# Patient Record
Sex: Female | Born: 1945 | Race: White | Hispanic: No | Marital: Married | Smoking: Former smoker
Health system: Southern US, Community
[De-identification: ages and names within clinical notes are randomized; demographics above are authoritative.]

## PROBLEM LIST (undated history)

## (undated) DIAGNOSIS — E7800 Pure hypercholesterolemia, unspecified: Secondary | ICD-10-CM

## (undated) DIAGNOSIS — C801 Malignant (primary) neoplasm, unspecified: Secondary | ICD-10-CM

## (undated) DIAGNOSIS — Z9221 Personal history of antineoplastic chemotherapy: Secondary | ICD-10-CM

## (undated) DIAGNOSIS — I10 Essential (primary) hypertension: Secondary | ICD-10-CM

## (undated) DIAGNOSIS — C50919 Malignant neoplasm of unspecified site of unspecified female breast: Secondary | ICD-10-CM

## (undated) DIAGNOSIS — Z853 Personal history of malignant neoplasm of breast: Secondary | ICD-10-CM

## (undated) DIAGNOSIS — Z923 Personal history of irradiation: Secondary | ICD-10-CM

## (undated) DIAGNOSIS — M1990 Unspecified osteoarthritis, unspecified site: Secondary | ICD-10-CM

## (undated) DIAGNOSIS — IMO0002 Reserved for concepts with insufficient information to code with codable children: Secondary | ICD-10-CM

## (undated) HISTORY — DX: Pure hypercholesterolemia, unspecified: E78.00

## (undated) HISTORY — DX: Unspecified osteoarthritis, unspecified site: M19.90

## (undated) HISTORY — DX: Malignant (primary) neoplasm, unspecified: C80.1

## (undated) HISTORY — PX: TONSILLECTOMY: SUR1361

## (undated) HISTORY — DX: Essential (primary) hypertension: I10

## (undated) HISTORY — PX: BREAST CYST ASPIRATION: SHX578

## (undated) HISTORY — DX: Personal history of malignant neoplasm of breast: Z85.3

---

## 1981-12-12 HISTORY — PX: ABDOMINAL HYSTERECTOMY: SHX81

## 1997-12-12 HISTORY — PX: ABDOMINOPLASTY: SUR9

## 2001-09-03 ENCOUNTER — Other Ambulatory Visit: Admission: RE | Admit: 2001-09-03 | Discharge: 2001-09-03 | Payer: Self-pay | Admitting: Family Medicine

## 2004-09-15 ENCOUNTER — Ambulatory Visit: Payer: Self-pay | Admitting: Unknown Physician Specialty

## 2004-09-21 ENCOUNTER — Ambulatory Visit: Payer: Self-pay

## 2005-11-17 ENCOUNTER — Ambulatory Visit: Payer: Self-pay

## 2006-11-20 ENCOUNTER — Ambulatory Visit: Payer: Self-pay | Admitting: Plastic Surgery

## 2008-01-16 ENCOUNTER — Ambulatory Visit: Payer: Self-pay | Admitting: Plastic Surgery

## 2008-02-29 ENCOUNTER — Encounter: Payer: Self-pay | Admitting: Family Medicine

## 2008-12-12 DIAGNOSIS — IMO0001 Reserved for inherently not codable concepts without codable children: Secondary | ICD-10-CM

## 2008-12-12 DIAGNOSIS — C50919 Malignant neoplasm of unspecified site of unspecified female breast: Secondary | ICD-10-CM

## 2008-12-12 DIAGNOSIS — Z9221 Personal history of antineoplastic chemotherapy: Secondary | ICD-10-CM

## 2008-12-12 DIAGNOSIS — C801 Malignant (primary) neoplasm, unspecified: Secondary | ICD-10-CM

## 2008-12-12 DIAGNOSIS — Z853 Personal history of malignant neoplasm of breast: Secondary | ICD-10-CM

## 2008-12-12 HISTORY — DX: Malignant neoplasm of unspecified site of unspecified female breast: C50.919

## 2008-12-12 HISTORY — DX: Personal history of malignant neoplasm of breast: Z85.3

## 2008-12-12 HISTORY — DX: Malignant (primary) neoplasm, unspecified: C80.1

## 2008-12-12 HISTORY — PX: OTHER SURGICAL HISTORY: SHX169

## 2008-12-12 HISTORY — DX: Reserved for inherently not codable concepts without codable children: IMO0001

## 2008-12-12 HISTORY — PX: BREAST LUMPECTOMY: SHX2

## 2008-12-12 HISTORY — DX: Personal history of antineoplastic chemotherapy: Z92.21

## 2008-12-12 HISTORY — PX: MM BREAST STEREO BX*L*R/S: HXRAD495

## 2008-12-12 HISTORY — PX: BREAST BIOPSY: SHX20

## 2009-02-10 ENCOUNTER — Ambulatory Visit: Payer: Self-pay | Admitting: Plastic Surgery

## 2009-02-11 ENCOUNTER — Ambulatory Visit: Payer: Self-pay | Admitting: Plastic Surgery

## 2009-02-11 ENCOUNTER — Encounter: Payer: Self-pay | Admitting: Family Medicine

## 2009-03-18 ENCOUNTER — Ambulatory Visit: Payer: Self-pay | Admitting: General Surgery

## 2009-03-27 ENCOUNTER — Ambulatory Visit: Payer: Self-pay | Admitting: General Surgery

## 2009-04-03 ENCOUNTER — Ambulatory Visit: Payer: Self-pay | Admitting: General Surgery

## 2009-04-11 ENCOUNTER — Ambulatory Visit: Payer: Self-pay | Admitting: Internal Medicine

## 2009-04-14 ENCOUNTER — Ambulatory Visit: Payer: Self-pay | Admitting: Internal Medicine

## 2009-05-12 ENCOUNTER — Ambulatory Visit: Payer: Self-pay | Admitting: Internal Medicine

## 2009-05-19 ENCOUNTER — Ambulatory Visit: Payer: Self-pay | Admitting: General Surgery

## 2009-05-20 ENCOUNTER — Ambulatory Visit: Payer: Self-pay | Admitting: Internal Medicine

## 2009-06-11 ENCOUNTER — Ambulatory Visit: Payer: Self-pay | Admitting: Internal Medicine

## 2009-07-12 ENCOUNTER — Ambulatory Visit: Payer: Self-pay | Admitting: Internal Medicine

## 2009-08-12 ENCOUNTER — Ambulatory Visit: Payer: Self-pay | Admitting: Internal Medicine

## 2009-08-20 ENCOUNTER — Ambulatory Visit: Payer: Self-pay | Admitting: General Surgery

## 2009-09-11 ENCOUNTER — Ambulatory Visit: Payer: Self-pay | Admitting: Internal Medicine

## 2009-10-12 ENCOUNTER — Ambulatory Visit: Payer: Self-pay | Admitting: Internal Medicine

## 2009-11-11 ENCOUNTER — Ambulatory Visit: Payer: Self-pay | Admitting: Internal Medicine

## 2009-12-12 ENCOUNTER — Ambulatory Visit: Payer: Self-pay | Admitting: Internal Medicine

## 2009-12-12 HISTORY — PX: COLONOSCOPY: SHX174

## 2010-01-12 ENCOUNTER — Ambulatory Visit: Payer: Self-pay | Admitting: Internal Medicine

## 2010-02-09 ENCOUNTER — Ambulatory Visit: Payer: Self-pay | Admitting: Internal Medicine

## 2010-02-11 ENCOUNTER — Ambulatory Visit: Payer: Self-pay | Admitting: General Surgery

## 2010-03-12 ENCOUNTER — Ambulatory Visit: Payer: Self-pay | Admitting: Internal Medicine

## 2010-04-11 ENCOUNTER — Ambulatory Visit: Payer: Self-pay | Admitting: Internal Medicine

## 2010-05-12 ENCOUNTER — Ambulatory Visit: Payer: Self-pay | Admitting: Internal Medicine

## 2010-06-10 IMAGING — NM NM  CARDIAC MUGA REST SCAN 2 0F 2
4 series · 24 of 24 positions shown · non-contrast
Comparison: none

REASON FOR EXAM: BREAST CA PRE CHEMO LVEF
COMMENTS:

[Series 1000: lao 45-gated (results) · 6.59mm/px · 6 of 24 frames shown]
[frame 3/24]
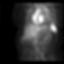
[frame 7/24]
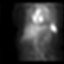
[frame 11/24]
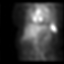
[frame 15/24]
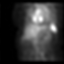
[frame 19/24]
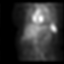
[frame 23/24]
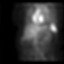

[Series 1000: lao 70-gated · 6.59mm/px · 6 of 24 frames shown]
[frame 3/24]
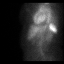
[frame 7/24]
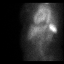
[frame 11/24]
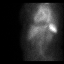
[frame 15/24]
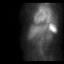
[frame 19/24]
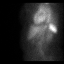
[frame 23/24]
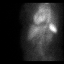

[Series 1000: ant-gated · 6.59mm/px · 6 of 24 frames shown]
[frame 3/24]
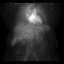
[frame 7/24]
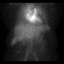
[frame 11/24]
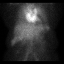
[frame 15/24]
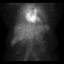
[frame 19/24]
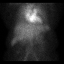
[frame 23/24]
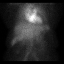

[Series 1000: lao 45-gated · 6.59mm/px · 6 of 24 frames shown]
[frame 3/24]
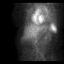
[frame 7/24]
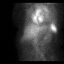
[frame 11/24]
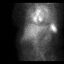
[frame 15/24]
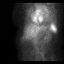
[frame 19/24]
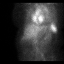
[frame 23/24]
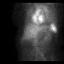

[24 of 24 positions shown; findings below may reference images not displayed]

PROCEDURE:     NM  - NM REST MUGA SCAN [DATE] OF [DATE]  - [DATE] [DATE] [DATE]  [DATE]

RESULT:     Following intravenous administration of 3.0 ml PYP and 25.61 mCi
Technetium 99m Pertechnetate, Rest MUGA Scan was performed. Review of the
left ventricular Cine loop shows no definite akinetic or dyskinetic
myocardial segments.

The left ventricular ejection fraction measures 53.5% which is within the
normal range.
IMPRESSION: Normal study. The left ventricular ejection fraction measures
53.5%.

## 2010-07-12 ENCOUNTER — Ambulatory Visit: Payer: Self-pay | Admitting: Internal Medicine

## 2010-07-14 ENCOUNTER — Ambulatory Visit: Payer: Self-pay | Admitting: Internal Medicine

## 2010-08-12 ENCOUNTER — Ambulatory Visit: Payer: Self-pay | Admitting: Internal Medicine

## 2010-08-31 ENCOUNTER — Ambulatory Visit: Payer: Self-pay | Admitting: Unknown Physician Specialty

## 2010-09-20 ENCOUNTER — Ambulatory Visit: Payer: Self-pay | Admitting: General Surgery

## 2010-12-20 ENCOUNTER — Ambulatory Visit
Admission: RE | Admit: 2010-12-20 | Discharge: 2010-12-20 | Payer: Self-pay | Source: Home / Self Care | Attending: Family Medicine | Admitting: Family Medicine

## 2010-12-20 ENCOUNTER — Encounter: Payer: Self-pay | Admitting: Family Medicine

## 2010-12-20 DIAGNOSIS — Z860100 Personal history of colon polyps, unspecified: Secondary | ICD-10-CM | POA: Insufficient documentation

## 2010-12-20 DIAGNOSIS — M1990 Unspecified osteoarthritis, unspecified site: Secondary | ICD-10-CM | POA: Insufficient documentation

## 2010-12-20 DIAGNOSIS — I10 Essential (primary) hypertension: Secondary | ICD-10-CM | POA: Insufficient documentation

## 2010-12-20 DIAGNOSIS — Z853 Personal history of malignant neoplasm of breast: Secondary | ICD-10-CM | POA: Insufficient documentation

## 2010-12-22 ENCOUNTER — Encounter: Payer: Self-pay | Admitting: Family Medicine

## 2010-12-22 ENCOUNTER — Ambulatory Visit: Payer: Self-pay | Admitting: Family Medicine

## 2010-12-23 ENCOUNTER — Encounter: Payer: Self-pay | Admitting: Family Medicine

## 2011-01-05 ENCOUNTER — Ambulatory Visit
Admission: RE | Admit: 2011-01-05 | Discharge: 2011-01-05 | Payer: Self-pay | Source: Home / Self Care | Attending: Family Medicine | Admitting: Family Medicine

## 2011-01-05 ENCOUNTER — Other Ambulatory Visit: Payer: Self-pay | Admitting: Family Medicine

## 2011-01-05 ENCOUNTER — Encounter: Payer: Self-pay | Admitting: Family Medicine

## 2011-01-05 LAB — BASIC METABOLIC PANEL
Calcium: 10.3 mg/dL (ref 8.4–10.5)
Creatinine, Ser: 0.8 mg/dL (ref 0.4–1.2)
GFR: 79.82 mL/min (ref >60.00)
Glucose, Bld: 94 mg/dL (ref 70–99)
Sodium: 140 mEq/L (ref 135–145)

## 2011-01-05 LAB — LIPID PANEL
Cholesterol: 332 mg/dL — ABNORMAL HIGH (ref 0–200)
HDL: 43.1 mg/dL (ref >39.00)
Total CHOL/HDL Ratio: 8
Triglycerides: 354 mg/dL — ABNORMAL HIGH (ref 0.0–149.0)

## 2011-01-13 NOTE — Assessment & Plan Note (Signed)
Summary: Medicare physical   Vital Signs:  Patient profile:   65 year old female Height:      58.50 inches Weight:      152.50 pounds BMI:     31.44 Temp:     97.9 degrees F oral Pulse rate:   60 / minute Pulse rhythm:   regular BP sitting:   122 / 82  (right arm) Cuff size:   regular  Vitals Entered By: Linde Gillis CMA Duncan Dull) (January 05, 2011 9:03 AM) CC: medicare physicial  Vision Screening:Left eye w/o correction: 20 / 20 Right Eye w/o correction: 20 / 15 Both eyes w/o correction:  20/ 20  Color vision testing: normal      Vision Entered By: Linde Gillis CMA Duncan Dull) (January 05, 2011 9:11 AM)  Hearing Screen  20db HL: Left  500 hz: 20db 1000 hz: 20db 2000 hz: 20db 4000 hz: 20db Right  500 hz: 20db 2000 hz: 20db 4000 hz: 20db   Hearing Testing Entered By: Linde Gillis CMA Duncan Dull) (January 05, 2011 9:11 AM)   History of Present Illness: I have personally reviewed the Medicare Annual Wellness questionnaire and have noted 1.   The patient's medical and social history- h/o breast CA- diagnosed April 2010, s/p lumpectomy, radiation and chemotherapy ( Paclitaxel x 12 weeks, herceptin x 1 year).  Doing well.  Followed by Dr. Sherrlyn Hock every six months.  Recent mammogram was normal.  No residual side effects from the chemotherapy or radiation therapy. Rad onc is Dr. Rushie Chestnut.  HTN- gets white coat HTN.  On Metoprolol 50 gm daily and HCTZ 12.5 mg daily.  No HA, blurred vision, CP, SOB or LE edema.  2.   Their use of alcohol, tobacco or illicit drugs- none 3.   Their current medications and supplements 4.   The patient's functional ability including ADL's, fall risks, home safety risks and hearing or visual  impairment.- see geriatrics assessment 5.   Diet and physical activities- very physically active. 6.   Evidence for depression or mood disorders- denies any signs or symptoms of depression         Allergies: 1)  ! * Ambien  Review of Systems      See  HPI General:  Denies malaise. Eyes:  Denies blurring. ENT:  Denies difficulty swallowing. CV:  Denies chest pain or discomfort. Resp:  Denies shortness of breath. GI:  Denies abdominal pain. GU:  Denies dysuria. MS:  Denies joint pain, joint redness, and joint swelling. Derm:  Denies rash. Neuro:  Denies headaches. Psych:  Denies anxiety and depression. Endo:  Denies cold intolerance and heat intolerance. Heme:  Denies abnormal bruising and bleeding.  Physical Exam  General:  alert, well-developed, and well-nourished.  alert, well-developed, and well-nourished.   Head:  normocephalic and atraumatic.  normocephalic and atraumatic.   Eyes:  vision grossly intact, pupils equal, pupils round, and pupils reactive to light.  vision grossly intact, pupils equal, pupils round, and pupils reactive to light.   Ears:  R ear normal and L ear normal.  R ear normal and L ear normal.   Nose:  no external deformity.  no external deformity.   Mouth:  good dentition.  good dentition.   Neck:  No deformities, masses, or tenderness noted. Breasts:  old surgical scars, no masses and no abnormal thickening.   Lungs:  Normal respiratory effort, chest expands symmetrically. Lungs are clear to auscultation, no crackles or wheezes. Heart:  Normal rate and regular rhythm. S1 and  S2 normal without gallop, murmur, click, rub or other extra sounds. Abdomen:  Bowel sounds positive,abdomen soft and non-tender without masses, organomegaly or hernias noted. Rectal:  no external abnormalities, no hemorrhoids, and no masses.   Genitalia:  normal introitus, no external lesions, no vaginal discharge, mucosa pink and moist, and no vaginal or cervical lesions.   vaginal atrophy Msk:  No deformity or scoliosis noted of thoracic or lumbar spine.   Extremities:  No clubbing, cyanosis, edema, or deformity noted with normal full range of motion of all joints.   Neurologic:  No cranial nerve deficits noted. Station and gait are  normal. Plantar reflexes are down-going bilaterally. DTRs are symmetrical throughout. Sensory, motor and coordinative functions appear intact. Skin:  Intact without suspicious lesions or rashes Psych:  Cognition and judgment appear intact. Alert and cooperative with normal attention span and concentration. No apparent delusions, illusions, hallucinations   Impression & Recommendations:  Problem # 1:  PREVENTIVE HEALTH CARE (ICD-V70.0) The patients weight, height, BMI and visual acuity have been recorded in the chart I have made referrals, counseling and provided education to the patient based review of the above and I have provided the pt with a written personalized care plan for preventive services.   Orders: Venipuncture (16109) Welcome to Medicare, Physical (U0454) Pelvic & Breast Exam ( Medicare)  (G0101) EKG w/ Interpretation (93000)  Complete Medication List: 1)  Metoprolol Tartrate 50 Mg Tabs (Metoprolol tartrate) .... Take one tablet by mouth daily 2)  Hydrochlorothiazide 12.5 Mg Caps (Hydrochlorothiazide) .... Take one tablet by mouth daily 3)  Diclofenac Sodium 75 Mg Tbec (Diclofenac sodium) .... Take one tablet by mouth daily 4)  Multivitamins Caps (Multiple vitamin) .... Take one tablet by mouth daily 5)  Aspirin 81 Mg Tabs (Aspirin) .... Take one tablet by mouth daily 6)  Vitamin D3 1000 Unit Caps (Cholecalciferol) .... Take one tablet by mouth daily 7)  Fish Oil 1000 Mg Caps (Omega-3 fatty acids) .... Take one tablet by mouth two times a day 8)  Milk Thistle 500 Mg Caps (Milk thistle) .... Take one tablet by mouth three times a day  Other Orders: TLB-Lipid Panel (80061-LIPID) TLB-BMP (Basic Metabolic Panel-BMET) (80048-METABOL)   Orders Added: 1)  TLB-Lipid Panel [80061-LIPID] 2)  TLB-BMP (Basic Metabolic Panel-BMET) [80048-METABOL] 3)  Venipuncture [09811] 4)  Welcome to Medicare, Physical [G0402] 5)  Pelvic & Breast Exam ( Medicare)  [G0101] 6)  EKG w/  Interpretation [93000]    Current Allergies (reviewed today): ! * AMBIEN  PAP Next Due:  Not Indicated    Prevention & Chronic Care Immunizations   Influenza vaccine: Not documented    Tetanus booster: Not documented    Pneumococcal vaccine: Pneumovax  (12/20/2010)   Pneumococcal vaccine due: None    H. zoster vaccine: 12/20/2010: Zostavax  Colorectal Screening   Hemoccult: Not documented   Hemoccult due: Not Indicated    Colonoscopy: abnormal- diverticulosis, polyps  (08/31/2010)   Colonoscopy due: 09/01/2015  Other Screening   Pap smear: Not documented   Pap smear due: Not Indicated    Mammogram: normal  (08/31/2010)   Mammogram due: 02/24/2011    DXA bone density scan: Not documented   DXA bone density action/deferral: Ordered  (12/20/2010)   DXA scan due: Not Indicated    Smoking status: Not documented  Lipids   Total Cholesterol: Not documented   Lipid panel action/deferral: Lipid Panel ordered   LDL: Not documented   LDL Direct: Not documented   HDL: Not documented  Triglycerides: Not documented  Hypertension   Last Blood Pressure: 122 / 82  (01/05/2011)   Serum creatinine: Not documented   BMP action: Ordered   Serum potassium Not documented  Self-Management Support :    Hypertension self-management support: Not documented   Geriatric Assessment:  Activities of Daily Living:    Bathing-independent    Dressing-independent    Eating-independent    Toileting-independent    Transferring-independent    Continence-independent  Instrumental Activities of Daily Living:    Transportation-independent    Meal/Food Preparation-independent    Shopping Errands-independent    Housekeeping/Chores-independent    Money Management/Finances-independent    Medication Management-independent    Ability to Use Telephone-independent    Laundry-independent

## 2011-01-13 NOTE — Letter (Signed)
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

## 2011-01-13 NOTE — Letter (Signed)
Summary: Records Dated 05-20-09 thru 07-28-10/[facility]  Records Dated 05-20-09 thru 07-28-10/[facility]   Imported By: Lanelle Bal 12/27/2010 15:22:43  _____________________________________________________________________  External Attachment:    Type:   Image     Comment:   External Document

## 2011-01-13 NOTE — Assessment & Plan Note (Signed)
Summary: new medicare patient/alc   Vital Signs:  Patient profile:   65 year old female Height:      58.50 inches Weight:      148.25 pounds BMI:     30.57 Temp:     97.9 degrees F oral Pulse rate:   72 / minute Pulse rhythm:   regular BP sitting:   140 / 90  (right arm) Cuff size:   regular  Vitals Entered By: Linde Gillis CMA Duncan Dull) (December 20, 2010 1:42 PM) CC: new medicare patient   History of Present Illness: 65 yo here to [facility] care.  1.  h/o breast CA- diagnosed April 2010, s/p lumpectomy, radiation and chemotherapy ( Paclitaxel x 12 weeks, herceptin x 1 year).  Doing well.  Followed by Dr. Sherrlyn Hock every six months.  Recent mammogram was normal.  No residual side effects from the chemotherapy or radiation therapy. Rad onc is Dr. Rosilyn Mings remember when last DEXA scan was done.  2.  HTN- gets white coat HTN.  On Metoprolol 50 gm daily and HCTZ 12.5 mg daily.  No HA, blurred vision, CP, SOB or LE edema.  Well woman- started Medicare in July, has not yet had a Welcome to Medicare physical. Does not think she has ever received Zostavax or Pneumovax.  Current Medications (verified): 1)  Metoprolol Tartrate 50 Mg Tabs (Metoprolol Tartrate) .... Take One Tablet By Mouth Daily 2)  Hydrochlorothiazide 12.5 Mg Caps (Hydrochlorothiazide) .... Take One Tablet By Mouth Daily 3)  Diclofenac Sodium 75 Mg Tbec (Diclofenac Sodium) .... Take One Tablet By Mouth Daily 4)  Multivitamins  Caps (Multiple Vitamin) .... Take One Tablet By Mouth Daily 5)  Aspirin 81 Mg Tabs (Aspirin) .... Take One Tablet By Mouth Daily 6)  Vitamin D3 1000 Unit Caps (Cholecalciferol) .... Take One Tablet By Mouth Daily 7)  Fish Oil 1000 Mg Caps (Omega-3 Fatty Acids) .... Take One Tablet By Mouth Two Times A Day 8)  Milk Thistle 500 Mg Caps (Milk Thistle) .... Take One Tablet By Mouth Two Times A Day  Allergies (verified): 1)  ! * Ambien  Past History:  Family History: Last updated:  12/20/2010 mother- alive, 69 yo Family History of Stroke F 1st degree relative <60 Breast CA- paternal aunt in her 54s  Social History: Last updated: 12/20/2010 Married 2 children Retired Tourist information centre manager. Lives in [place].  Past Medical History: Breast cancer, hx of (03/17/2009)-  s/p radiation and chemotherapy, followed by Dr. Sherrlyn Hock (onc), [place] (Rad onc). 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  Review of Systems      See HPI General:  Denies malaise. Eyes:  Denies blurring. ENT:  Denies difficulty swallowing. CV:  Denies chest pain or discomfort. Resp:  Denies shortness of breath. GI:  Denies abdominal pain and bloody stools. GU:  Denies dysuria. MS:  Complains of joint pain; denies joint redness, joint swelling, and loss of strength. Derm:  Denies rash. Neuro:  Denies headaches, tingling, visual disturbances, and weakness. Psych:  Denies anxiety and depression. Endo:  Denies cold intolerance and heat intolerance. Heme:  Denies abnormal bruising and bleeding.  Physical Exam  General:  alert, well-developed, and well-nourished.  alert, well-developed, and well-nourished.   Head:  normocephalic and  atraumatic.  normocephalic and atraumatic.   Eyes:  vision grossly intact, pupils equal, pupils round, and pupils reactive to light.  vision grossly intact, pupils equal, pupils round, and pupils reactive to light.   Ears:  R ear normal and L ear normal.  R ear normal and L ear normal.   Nose:  no external deformity.  no external deformity.   Mouth:  good dentition.  good dentition.   Neck:  No deformities, masses, or tenderness noted. Lungs:  Normal  respiratory effort, chest expands symmetrically. Lungs are clear to auscultation, no crackles or wheezes. Heart:  Normal rate and regular rhythm. S1 and S2 normal without gallop, murmur, click, rub or other extra sounds. Abdomen:  Bowel sounds positive,abdomen soft and non-tender without masses, organomegaly or hernias noted. Msk:  No deformity or scoliosis noted of thoracic or lumbar spine.   Extremities:  no edema Neurologic:  alert & oriented X3 and gait normal.  alert & oriented X3 and gait normal.   Skin:  Intact without suspicious lesions or rashes Cervical Nodes:  No lymphadenopathy noted Axillary Nodes:  No palpable lymphadenopathy Psych:  Oriented X3, memory intact for recent and remote, normally interactive, good eye contact, not anxious appearing, and not depressed appearing.  Oriented X3, memory intact for recent and remote, normally interactive, good eye contact, not anxious appearing, and not depressed appearing.     Impression & Recommendations:  Problem # 1:  BREAST CANCER, HX OF (ICD-V10.3) Assessment Unchanged doing well.  Followed by Dr. Sherrlyn Hock every 6 months.  Problem # 2:  HYPERTENSION (ICD-401.9) Assessment: Unchanged  elevated today, per pt white coat HTN.  Awaiting records. Her updated medication list for this problem includes:    Metoprolol Tartrate 50 Mg Tabs (Metoprolol tartrate) .Marland Kitchen... Take one tablet by mouth daily    Hydrochlorothiazide 12.5 Mg Caps (Hydrochlorothiazide) .Marland Kitchen... Take one tablet by mouth daily  Her updated medication list for this problem includes:    Metoprolol Tartrate 50 Mg Tabs (Metoprolol tartrate) .Marland Kitchen... Take one tablet by mouth daily    Hydrochlorothiazide 12.5 Mg Caps (Hydrochlorothiazide) .Marland Kitchen... Take one tablet by mouth daily  Problem # 3:  ? of OSTEOPENIA (ICD-733.90) Assessment: New  Will order DEXA scan today given h/o breast CA s/p treatment. Her updated medication list for this problem includes:    Vitamin D3 1000 Unit Caps  (Cholecalciferol) .Marland Kitchen... Take one tablet by mouth daily  Orders: Radiology Referral (Radiology)  Her updated medication list for this problem includes:    Vitamin D3 1000 Unit Caps (Cholecalciferol) .Marland Kitchen... Take one tablet by mouth daily  Problem # 4:  Preventive Health Care (ICD-V70.0) Assessment: Comment Only zostavax and pneumovax today. schedule welcome to medicare visit.  Complete Medication List: 1)  Metoprolol Tartrate 50 Mg Tabs (Metoprolol tartrate) .... Take one tablet by mouth daily 2)  Hydrochlorothiazide 12.5 Mg Caps (Hydrochlorothiazide) .... Take one tablet by mouth daily 3)  Diclofenac Sodium 75 Mg Tbec (Diclofenac sodium) .... Take one tablet by mouth daily 4)  Multivitamins Caps (Multiple vitamin) .... Take one tablet by mouth daily 5)  Aspirin 81 Mg Tabs (Aspirin) .... Take one tablet by mouth daily 6)  Vitamin D3 1000 Unit Caps (Cholecalciferol) .... Take one tablet by mouth daily 7)  Fish Oil 1000 Mg Caps (Omega-3 fatty acids) .... Take one tablet by mouth two times a day 8)  Milk Thistle 500 Mg Caps (Milk thistle) .... Take one tablet by mouth two times a day  Other Orders: Pneumococcal Vaccine (  14782) Admin 1st Vaccine (95621) Zoster (Shingles) Vaccine Live [phone]) Admin of Any Addtl Vaccine (78469)  Patient Instructions: 1)  Please stop by to see Shirlee Limerick on your way out. 2)  Schedule a Welcome to Medicare visit. 3)  It was great to meet you.   Orders Added: 1)  Radiology Referral [Radiology] 2)  Pneumococcal Vaccine [90732] 3)  Admin 1st Vaccine [90471] 4)  Zoster (Shingles) Vaccine Live [90736] 5)  Admin of Any Addtl Vaccine [90472] 6)  New Patient Level III [62952]   Pneumonia Vaccine (to be given today)  Zostavax (to be given today)   Pneumonia Vaccine (to be given today)  Current Allergies (reviewed today): ! * AMBIEN   Prevention & Chronic Care Immunizations   Influenza vaccine: Not documented    Tetanus booster: Not documented     Pneumococcal vaccine: Pneumovax  (12/20/2010)    H. zoster vaccine: 12/20/2010: Zostavax  Colorectal Screening   Hemoccult: Not documented   Hemoccult due: Not Indicated    Colonoscopy: abnormal- diverticulosis, polyps  (08/31/2010)   Colonoscopy due: 09/01/2015  Other Screening   Pap smear: Not documented    Mammogram: normal  (08/31/2010)   Mammogram due: 02/24/2011    DXA bone density scan: Not documented   DXA bone density action/deferral: Ordered  (12/20/2010)   DXA scan due: Not Indicated    Smoking status: Not documented  Lipids   Total Cholesterol: Not documented   LDL: Not documented   LDL Direct: Not documented   HDL: Not documented   Triglycerides: Not documented  Hypertension   Last Blood Pressure: 140 / 90  (12/20/2010)   Serum creatinine: Not documented   Serum potassium Not documented  Self-Management Support :    Hypertension self-management support: Not documented   Nursing Instructions: Give Pneumovax today Give Herpes zoster vaccine today Schedule screening DXA bone density scan (see order)    Flex Sig Next Due:  Not Indicated Colonoscopy Result Date:  08/31/2010 Colonoscopy Result:  abnormal- diverticulosis, polyps Colonoscopy Next Due:  5 yr Hemoccult Next Due:  Not Indicated Mammogram Result Date:  08/26/2010 Mammogram Result:  normal historical Mammogram Next Due:  6 mo Bone Density Next Due: Not Indicated

## 2011-01-13 NOTE — Miscellaneous (Signed)
Summary: Waiver of Liability for Zostavax  Waiver of Liability for Zostavax   Imported By: Maryln Gottron 12/23/2010 13:38:31  _____________________________________________________________________  External Attachment:    Type:   Image     Comment:   External Document

## 2011-01-13 NOTE — Letter (Signed)
Summary: Results Follow up Letter  [facility]  [address] [place], [place] [address]   Phone: [phone]  Fax: [phone]    12/23/2010 MRN: [id number]  [facility] [address] [place], [place]  [address]  Dear Ms. Gosselin,  The following are the results of your recent test(s):  Test         Result    Pap Smear:        Normal _____  Not Normal _____ Comments: ______________________________________________________ Cholesterol: LDL(Bad cholesterol):         Your goal is less than:         HDL (Good cholesterol):       Your goal is more than: Comments:  ______________________________________________________ Mammogram:        Normal _____  Not Normal _____ Comments:  ___________________________________________________________________ Hemoccult:        Normal _____  Not normal _______ Comments:    _____________________________________________________________________ Other Tests: Your Bone Density test was normal.  Enclosed is a copy of the report.    We routinely do not discuss normal results over the telephone.  If you desire a copy of the results, or you have any questions about this information we can discuss them at your next office visit.   Sincerely,      Dr. Ruthe Mannan

## 2011-01-14 ENCOUNTER — Ambulatory Visit: Payer: Self-pay | Admitting: Internal Medicine

## 2011-02-10 ENCOUNTER — Ambulatory Visit: Payer: Self-pay | Admitting: Internal Medicine

## 2011-02-14 ENCOUNTER — Other Ambulatory Visit: Payer: Self-pay | Admitting: Family Medicine

## 2011-02-14 ENCOUNTER — Other Ambulatory Visit (INDEPENDENT_AMBULATORY_CARE_PROVIDER_SITE_OTHER): Payer: Medicare Other

## 2011-02-14 ENCOUNTER — Encounter (INDEPENDENT_AMBULATORY_CARE_PROVIDER_SITE_OTHER): Payer: Self-pay | Admitting: *Deleted

## 2011-02-14 DIAGNOSIS — E785 Hyperlipidemia, unspecified: Secondary | ICD-10-CM

## 2011-02-14 LAB — LIPID PANEL: Triglycerides: 276 mg/dL — ABNORMAL HIGH (ref 0.0–149.0)

## 2011-02-14 LAB — HEPATIC FUNCTION PANEL
ALT: 38 U/L — ABNORMAL HIGH (ref 0–35)
AST: 28 U/L (ref 0–37)
Alkaline Phosphatase: 111 U/L (ref 39–117)
Total Bilirubin: 0.3 mg/dL (ref 0.3–1.2)

## 2011-02-14 LAB — LDL CHOLESTEROL, DIRECT: Direct LDL: 112.8 mg/dL

## 2011-03-23 ENCOUNTER — Ambulatory Visit: Payer: Self-pay | Admitting: General Surgery

## 2011-04-14 ENCOUNTER — Ambulatory Visit: Payer: Self-pay | Admitting: Internal Medicine

## 2011-04-15 ENCOUNTER — Telehealth: Payer: Self-pay | Admitting: *Deleted

## 2011-04-15 NOTE — Telephone Encounter (Signed)
Patient saw her oncologist this week and had lab work done her liver enzymes were elevated and Dr. Doree Barthel wanted you to be aware since she is on simvastatin. They are going to be faxing over the labs results.

## 2011-04-18 NOTE — Telephone Encounter (Signed)
Have we received these labs?

## 2011-04-18 NOTE — Telephone Encounter (Signed)
Patient advised as instructed via telephone.  She has an appt scheduled with Dr. Orvis Brill on 07/15/2011 and will have labs done that day and will get his office to fax Dr. Dayton Martes a copy of those results.

## 2011-04-18 NOTE — Telephone Encounter (Signed)
These labs have not yet been received or scanned into EPIC.  Faxed request form to [facility] today to get these labs faxed over asap.

## 2011-04-18 NOTE — Telephone Encounter (Signed)
Labs reviewed. Minimal elevation of LFTs. Repeat in 3 months (future orders placed). Will not d/c cholesterol medication unless they receive 3 times upper limits of normal.

## 2011-04-19 ENCOUNTER — Encounter: Payer: Self-pay | Admitting: Family Medicine

## 2011-05-13 ENCOUNTER — Ambulatory Visit: Payer: Self-pay | Admitting: Internal Medicine

## 2011-07-15 ENCOUNTER — Ambulatory Visit: Payer: Self-pay | Admitting: Internal Medicine

## 2011-08-01 ENCOUNTER — Other Ambulatory Visit: Payer: Self-pay | Admitting: *Deleted

## 2011-08-01 MED ORDER — DICLOFENAC SODIUM 75 MG PO TBEC: 75.0000 mg | DELAYED_RELEASE_TABLET | Freq: Two times a day (BID) | ORAL | Status: DC

## 2011-08-13 ENCOUNTER — Ambulatory Visit: Payer: Self-pay | Admitting: Internal Medicine

## 2011-09-27 ENCOUNTER — Other Ambulatory Visit: Payer: Self-pay | Admitting: *Deleted

## 2011-09-27 MED ORDER — METOPROLOL SUCCINATE ER 50 MG PO TB24: 50.0000 mg | ORAL_TABLET | Freq: Every day | ORAL | Status: DC

## 2011-09-29 ENCOUNTER — Ambulatory Visit: Payer: Self-pay | Admitting: General Surgery

## 2011-10-25 ENCOUNTER — Telehealth: Payer: Self-pay | Admitting: *Deleted

## 2011-10-25 MED ORDER — CIPROFLOXACIN HCL 250 MG PO TABS: 250.0000 mg | ORAL_TABLET | Freq: Two times a day (BID) | ORAL | Status: AC

## 2011-10-25 NOTE — Telephone Encounter (Signed)
Pt of Dr. Elmer Sow called stating that she has a UTI- has burning and pressure.  Her mother has died and pt's day today is filled with going back and forth to the funeral home, going to the grave site, and has visitation tonight and funeral tomorrow.  She says she doesn't have time to come in today, even to just leave a sample.  She is asking that antibiotic be called to [facility] [address].  She says she knows from past experience that the burning will be severe by tonight.  Please advise.

## 2011-10-25 NOTE — Telephone Encounter (Signed)
Given her circumstances - one time only -- can call in abx Will refill electronically  Follow up in 7-10 days with me to see that it is clear and see how she is doing please

## 2011-10-25 NOTE — Telephone Encounter (Signed)
Patient notified as instructed by telephone. Pt will call back in 7-10 days to update Dr Milinda Antis how she is doing. If she needs to be seen would prefer after Thanksgiving.

## 2011-12-22 ENCOUNTER — Other Ambulatory Visit: Payer: Self-pay | Admitting: *Deleted

## 2011-12-22 MED ORDER — SIMVASTATIN 20 MG PO TABS: 20.0000 mg | ORAL_TABLET | Freq: Every day | ORAL | Status: DC

## 2012-01-03 ENCOUNTER — Other Ambulatory Visit: Payer: Self-pay | Admitting: Family Medicine

## 2012-01-03 DIAGNOSIS — E785 Hyperlipidemia, unspecified: Secondary | ICD-10-CM

## 2012-01-03 DIAGNOSIS — I10 Essential (primary) hypertension: Secondary | ICD-10-CM

## 2012-01-05 ENCOUNTER — Other Ambulatory Visit (INDEPENDENT_AMBULATORY_CARE_PROVIDER_SITE_OTHER): Payer: Medicare Other

## 2012-01-05 DIAGNOSIS — E785 Hyperlipidemia, unspecified: Secondary | ICD-10-CM

## 2012-01-05 DIAGNOSIS — I10 Essential (primary) hypertension: Secondary | ICD-10-CM

## 2012-01-05 LAB — LIPID PANEL
LDL Cholesterol: 111 mg/dL — ABNORMAL HIGH (ref 0–99)
Total CHOL/HDL Ratio: 4
Triglycerides: 193 mg/dL — ABNORMAL HIGH (ref 0.0–149.0)

## 2012-01-05 LAB — BASIC METABOLIC PANEL
BUN: 20 mg/dL (ref 6–23)
CO2: 31 mEq/L (ref 19–32)
Chloride: 101 mEq/L (ref 96–112)
Creatinine, Ser: 0.9 mg/dL (ref 0.4–1.2)
Glucose, Bld: 131 mg/dL — ABNORMAL HIGH (ref 70–99)

## 2012-01-05 LAB — HEPATIC FUNCTION PANEL
Albumin: 4.3 g/dL (ref 3.5–5.2)
Alkaline Phosphatase: 89 U/L (ref 39–117)

## 2012-01-09 ENCOUNTER — Ambulatory Visit (INDEPENDENT_AMBULATORY_CARE_PROVIDER_SITE_OTHER): Payer: Medicare Other | Admitting: Family Medicine

## 2012-01-09 ENCOUNTER — Encounter: Payer: Self-pay | Admitting: Family Medicine

## 2012-01-09 VITALS — BP 180/90 | HR 76 | Temp 98.4°F | Ht 58.5 in | Wt 153.2 lb

## 2012-01-09 DIAGNOSIS — N3946 Mixed incontinence: Secondary | ICD-10-CM

## 2012-01-09 DIAGNOSIS — Z860100 Personal history of colon polyps, unspecified: Secondary | ICD-10-CM

## 2012-01-09 DIAGNOSIS — I10 Essential (primary) hypertension: Secondary | ICD-10-CM

## 2012-01-09 DIAGNOSIS — Z0000 Encounter for general adult medical examination without abnormal findings: Secondary | ICD-10-CM

## 2012-01-09 DIAGNOSIS — Z853 Personal history of malignant neoplasm of breast: Secondary | ICD-10-CM

## 2012-01-09 MED ORDER — HYDROCHLOROTHIAZIDE 12.5 MG PO TABS: 12.5000 mg | ORAL_TABLET | Freq: Every day | ORAL | Status: DC

## 2012-01-09 MED ORDER — METOPROLOL SUCCINATE ER 50 MG PO TB24: 50.0000 mg | ORAL_TABLET | Freq: Every day | ORAL | Status: DC

## 2012-01-09 MED ORDER — SIMVASTATIN 20 MG PO TABS: 20.0000 mg | ORAL_TABLET | Freq: Every day | ORAL | Status: DC

## 2012-01-09 MED ORDER — TOLTERODINE TARTRATE 1 MG PO TABS: 1.0000 mg | ORAL_TABLET | Freq: Two times a day (BID) | ORAL | Status: DC

## 2012-01-09 NOTE — Patient Instructions (Signed)
Great to see you. Please check your blood pressures at home and call me with an update of your readings.

## 2012-01-09 NOTE — Progress Notes (Signed)
The patient's medical and social history-  h/o breast CA- diagnosed April 2010, s/p lumpectomy, radiation and chemotherapy ( Paclitaxel x 12 weeks, herceptin x 1 year).  Doing well.  Followed by Dr. Sherrlyn Hock every six months.  Had mammogram last week, awaiting results.  No residual side effects from the chemotherapy or radiation therapy. Rad onc is Dr. Rushie Chestnut.  HTN- gets white coat HTN.  On Metoprolol 50 gm daily and HCTZ 12.5 mg daily.  No HA, blurred vision, CP, SOB or LE edema. Has not yet taken her blood pressure medication for the day but has it with her.  Well woman- DEXA scan excellent bone density, 12/2010. UTD on all prevention.  Mixed urinary incontinence- Has to wear a pad all the time, constant leakage. No dysuria.  I have personally reviewed the Medicare Annual Wellness questionnaire and have noted 1. The patient's medical and social history 2. Their use of alcohol, tobacco or illicit drugs 3. Their current medications and supplements 4. The patient's functional ability including ADL's, fall risks, home safety risks and hearing or visual             impairment. 5. Diet and physical activities 6. Evidence for depression or mood disorders  ROS: See HPI Patient reports no  vision/ hearing changes,anorexia, weight change, fever ,adenopathy, persistant / recurrent hoarseness, swallowing issues, chest pain, edema,persistant / recurrent cough, hemoptysis, dyspnea(rest, exertional, paroxysmal nocturnal), gastrointestinal  bleeding (melena, rectal bleeding), abdominal pain, excessive heart burn,  syncope, focal weakness, severe memory loss, concerning skin lesions, depression, anxiety, abnormal bruising/bleeding, major joint swelling, breast masses or abnormal vaginal bleeding.    Physical exam: BP 180/90  Pulse 76  Temp(Src) 98.4 F (36.9 C) (Oral)  Ht 4' 10.5" (1.486 m)  Wt 153 lb 4 oz (69.514 kg)  BMI 31.48 kg/m2  General:  Well-developed,well-nourished,in no acute  distress; alert,appropriate and cooperative throughout examination Head:  normocephalic and atraumatic.   Eyes:  vision grossly intact, pupils equal, pupils round, and pupils reactive to light.   Ears:  R ear normal and L ear normal.   Nose:  no external deformity.   Mouth:  good dentition.   Lungs:  Normal respiratory effort, chest expands symmetrically. Lungs are clear to auscultation, no crackles or wheezes. Heart:  Normal rate and regular rhythm. S1 and S2 normal without gallop, murmur, click, rub or other extra sounds. Abdomen:  Bowel sounds positive,abdomen soft and non-tender without masses, organomegaly or hernias noted. Msk:  No deformity or scoliosis noted of thoracic or lumbar spine.   Extremities:  No clubbing, cyanosis, edema, or deformity noted with normal full range of motion of all joints.   Neurologic:  alert & oriented X3 and gait normal.   Skin:  Intact without suspicious lesions or rashes Psych:  Cognition and judgment appear intact. Alert and cooperative with normal attention span and concentration. No apparent delusions, illusions, hallucinations    Assessment and Plan: 1. Routine general medical examination at a health care facility  The patients weight, height, BMI and visual acuity have been recorded in the chart I have made referrals, counseling and provided education to the patient based review of the above and I have provided the pt with a written personalized care plan for preventive services.   2. Mixed incontinence  Deteriorated.  Will try Detrol 1 mg twice daily, rx sent. Discussed avoiding bladder irritants as well.  3. HYPERTENSION  Deteriorated.  Has not taken her meds today.  Adivsed to take immediately and call with  home BP readings. The patient indicates understanding of these issues and agrees with the plan.   4. BREAST CANCER, HX OF  Stable, per oncology  5. COLONIC POLYPS, HX OF  IFOB

## 2012-01-12 ENCOUNTER — Encounter: Payer: Self-pay | Admitting: *Deleted

## 2012-01-12 ENCOUNTER — Other Ambulatory Visit: Payer: Medicare Other

## 2012-01-12 DIAGNOSIS — Z1211 Encounter for screening for malignant neoplasm of colon: Secondary | ICD-10-CM

## 2012-01-13 ENCOUNTER — Other Ambulatory Visit: Payer: Self-pay | Admitting: *Deleted

## 2012-01-13 MED ORDER — HYDROCHLOROTHIAZIDE 12.5 MG PO CAPS: 12.5000 mg | ORAL_CAPSULE | Freq: Every day | ORAL | Status: DC

## 2012-01-13 NOTE — Telephone Encounter (Signed)
Received faxed refill request, patient is requesting capsules instead of tablets.  Updated in Epic and new Rx sent in for capsules.

## 2012-01-16 ENCOUNTER — Ambulatory Visit: Payer: Self-pay | Admitting: Internal Medicine

## 2012-01-16 ENCOUNTER — Telehealth: Payer: Self-pay | Admitting: Family Medicine

## 2012-01-16 LAB — CBC CANCER CENTER
Basophil %: 0.4 %
Eosinophil #: 0.3 x10 3/mm (ref 0.0–0.7)
Eosinophil %: 3.2 %
HGB: 13.1 g/dL (ref 12.0–16.0)
Lymphocyte %: 32 %
MCHC: 34 g/dL (ref 32.0–36.0)
Monocyte %: 6.4 %
Neutrophil #: 4.6 x10 3/mm (ref 1.4–6.5)
Neutrophil %: 58 %
Platelet: 219 x10 3/mm (ref 150–440)

## 2012-01-16 NOTE — Telephone Encounter (Signed)
Patient was seen last Monday and her blood pressure was elevated.  She was told to call back with the readings of 3 days.  The readings were on 01/09/12-11:30am-173/80,2:00-165/86,7:30pm-147/76.  On 01/10/12 -8:30-183/91,12:00-171/88,6:00pm-182/90.  On 01/11/12-9:30am168/78,3:30pm-171/80,8:30pm-170/72.  On 01/12/12-8:30am-150/92.  [facility] and her bp was 180/97.

## 2012-01-16 NOTE — Telephone Encounter (Signed)
Please add a second dose of HCTZ daily- so she should be taking 25 mg daily. Please send in new rx for HCTZ 25 mg daily, continue other meds as directed. Call us tomorrow with her BP readings.

## 2012-01-16 NOTE — Telephone Encounter (Signed)
Patient advised as instructed via telephone.  Rx for HCTZ was sent to [facility] on 01/13/2012, patient requested that we send in capsules instead of tablets because the tablets are more expensive.  Advised patient that we did send in capsules.  Called [facility] to make sure they have capsules on file instead of tablets.

## 2012-01-17 ENCOUNTER — Telehealth: Payer: Self-pay | Admitting: Family Medicine

## 2012-01-17 NOTE — Telephone Encounter (Signed)
I left a message on patient's answering machine to call back and schedule an appointment to recheck her blood pressure.

## 2012-01-17 NOTE — Telephone Encounter (Signed)
Please have her make an appt to see me so we can recheck her BP here.

## 2012-01-17 NOTE — Telephone Encounter (Signed)
Patient called back with her blood pressure reading for today.  She took her blood pressure @ 1:10pm and it was left arm 188/96 and right arm 154/76.

## 2012-02-01 ENCOUNTER — Telehealth: Payer: Self-pay

## 2012-02-01 MED ORDER — OXYBUTYNIN CHLORIDE ER 5 MG PO TB24: 5.0000 mg | ORAL_TABLET | Freq: Every day | ORAL | Status: DC

## 2012-02-01 MED ORDER — OXYBUTYNIN CHLORIDE 5 MG PO TABS: 5.0000 mg | ORAL_TABLET | Freq: Two times a day (BID) | ORAL | Status: DC | PRN

## 2012-02-01 NOTE — Telephone Encounter (Signed)
Patient called regarding her Detrol prescription.  She needs it refilled and would like a 90 day supply, however, she is asking if there is something else that could be substituted that would be cheaper for her? If so, could you send a 90 day supply to her pharmacy? [employer]

## 2012-02-01 NOTE — Telephone Encounter (Addendum)
May try oxybutynin ER at 5mg  daily.  sent in - more side effects (dry mouth) but likely cheaper. Will also route to PCP in case any other recs.

## 2012-02-02 NOTE — Telephone Encounter (Signed)
Patient notified and will try the oxybutynin.

## 2012-02-10 ENCOUNTER — Ambulatory Visit: Payer: Self-pay | Admitting: Internal Medicine

## 2012-02-14 ENCOUNTER — Other Ambulatory Visit: Payer: Self-pay | Admitting: Family Medicine

## 2012-03-12 ENCOUNTER — Telehealth: Payer: Self-pay

## 2012-03-12 MED ORDER — HYDROCHLOROTHIAZIDE 25 MG PO TABS: 25.0000 mg | ORAL_TABLET | Freq: Every day | ORAL | Status: DC

## 2012-03-12 NOTE — Telephone Encounter (Signed)
HCTZ 25 mg daily sent to her pharmacy. Please have her follow up with me in 2-4 weeks.

## 2012-03-12 NOTE — Telephone Encounter (Signed)
Pt said she has been taking HCTZ 12.5 mg taking 2 caps daily. Pt is out of med and [facility] does not want to refill because said early. On 11/15/11 HCTZ 25 mg taking one cap daily was supposed to be called in. [facility] did not receive. On 01/17/12 pt was supposed to make appt to come in see Dr Dayton Martes and recheck BP. Pt did not make appt. Today pt said last couple of days her BP at home has been 136/80 and 140/80. Pt wants to know if  the HCTZ can be changed to 25 mg. Taking 1 daily or should pt be seen first by Dr Dayton Martes? Pt uses [facility] [address] and can be reached at [phone].

## 2012-03-12 NOTE — Telephone Encounter (Signed)
Advised pt, follow up appt scheduled.

## 2012-03-26 ENCOUNTER — Ambulatory Visit: Payer: Self-pay | Admitting: General Surgery

## 2012-04-04 ENCOUNTER — Ambulatory Visit (INDEPENDENT_AMBULATORY_CARE_PROVIDER_SITE_OTHER): Payer: Medicare Other | Admitting: Family Medicine

## 2012-04-04 ENCOUNTER — Encounter: Payer: Self-pay | Admitting: Family Medicine

## 2012-04-04 VITALS — BP 160/90 | HR 60 | Temp 98.3°F | Wt 142.0 lb

## 2012-04-04 DIAGNOSIS — R5383 Other fatigue: Secondary | ICD-10-CM

## 2012-04-04 DIAGNOSIS — I10 Essential (primary) hypertension: Secondary | ICD-10-CM

## 2012-04-04 DIAGNOSIS — E785 Hyperlipidemia, unspecified: Secondary | ICD-10-CM

## 2012-04-04 DIAGNOSIS — E538 Deficiency of other specified B group vitamins: Secondary | ICD-10-CM

## 2012-04-04 LAB — CBC WITH DIFFERENTIAL/PLATELET
Basophils Relative: 0.8 % (ref 0.0–3.0)
Eosinophils Relative: 5.7 % — ABNORMAL HIGH (ref 0.0–5.0)
Hemoglobin: 14 g/dL (ref 12.0–15.0)
Lymphocytes Relative: 34 % (ref 12.0–46.0)
MCHC: 33.3 g/dL (ref 30.0–36.0)
Monocytes Relative: 6.9 % (ref 3.0–12.0)
Neutro Abs: 3.1 10*3/uL (ref 1.4–7.7)
RBC: 4.64 Mil/uL (ref 3.87–5.11)

## 2012-04-04 LAB — VITAMIN B12: Vitamin B-12: 720 pg/mL (ref 211–911)

## 2012-04-04 MED ORDER — SIMVASTATIN 20 MG PO TABS: 20.0000 mg | ORAL_TABLET | Freq: Every day | ORAL | Status: DC

## 2012-04-04 MED ORDER — OXYBUTYNIN CHLORIDE ER 5 MG PO TB24: 5.0000 mg | ORAL_TABLET | Freq: Every day | ORAL | Status: DC

## 2012-04-04 MED ORDER — HYDROCHLOROTHIAZIDE 25 MG PO TABS: 25.0000 mg | ORAL_TABLET | Freq: Every day | ORAL | Status: DC

## 2012-04-04 MED ORDER — METOPROLOL SUCCINATE ER 50 MG PO TB24: 50.0000 mg | ORAL_TABLET | Freq: Every day | ORAL | Status: DC

## 2012-04-04 NOTE — Patient Instructions (Addendum)
Good to see you. Please keep me posted with your biopsy results. We will call you with your lab results. Please call me your blood pressure if they don't come down.

## 2012-04-04 NOTE — Progress Notes (Signed)
66 yo h/o follow up HTN with some other concerns.   HTN- gets white coat HTN.  On Metoprolol 50 gm daily and HCTZ 25 mg daily.  No HA, blurred vision, CP, SOB or LE edema. Bp had been well controlled. Was 120/70 at surgeon's office yesterday. Unfortunately, had to have a breast biopsy on right. With her h/o breast CA she has been understandably anxious since her biopsy. Was told she should have results with 3 days.  Fatigue- has been fatigued for years, since her chemotherapy. Last few months, increased fatigue.  No SOB or CP. No blood in stool. No dizziness.     Patient Active Problem List  Diagnoses  . HYPERTENSION  . OSTEOARTHRITIS  . BREAST CANCER, HX OF  . COLONIC POLYPS, HX OF  . HYPERLIPIDEMIA  . Routine general medical examination at a health care facility  . Mixed incontinence   No past medical history on file. No past surgical history on file. History  Substance Use Topics  . Smoking status: Former Games developer  . Smokeless tobacco: Not on file   Comment: quit 1983  . Alcohol Use: Yes     occassionally   No family history on file. Allergies  Allergen Reactions  . Zolpidem Tartrate     REACTION: hallucinations   Current Outpatient Prescriptions on File Prior to Visit  Medication Sig Dispense Refill  . aspirin 81 MG tablet Take 160 mg by mouth daily.      . Cholecalciferol (VITAMIN D3) 1000 UNITS CAPS Take by mouth daily.      . diclofenac (VOLTAREN) 75 MG EC tablet TAKE ONE TABLET BY MOUTH TWICE DAILY  180 tablet  0  . hydrochlorothiazide (HYDRODIURIL) 25 MG tablet Take 1 tablet (25 mg total) by mouth daily.  90 tablet  3  . metoprolol succinate (TOPROL-XL) 50 MG 24 hr tablet Take 1 tablet (50 mg total) by mouth daily.  90 tablet  3  . MILK THISTLE PO Take by mouth daily.      . MULTIPLE VITAMIN PO Take by mouth daily.      Marland Kitchen oxybutynin (DITROPAN-XL) 5 MG 24 hr tablet Take 1 tablet (5 mg total) by mouth daily.  90 tablet  3  . simvastatin (ZOCOR) 20 MG tablet  Take 1 tablet (20 mg total) by mouth at bedtime.  90 tablet  3   The PMH, PSH, Social History, Family History, Medications, and allergies have been reviewed in [facility], and have been updated if relevant.    ROS: See HPI   Physical exam: BP 160/90  Pulse 60  Temp(Src) 98.3 F (36.8 C) (Oral)  Wt 142 lb (64.411 kg) BP Readings from Last 3 Encounters:  04/04/12 160/90  01/09/12 180/90  01/05/11 122/82            General:  Well-developed,well-nourished,in no acute distress; alert,appropriate and cooperative throughout examination Head:  normocephalic and atraumatic.   Eyes:  vision grossly intact, pupils equal, pupils round, and pupils reactive to light.   Ears:  R ear normal and L ear normal.   Nose:  no external deformity.   Mouth:  good dentition.   Lungs:  Normal respiratory effort, chest expands symmetrically. Lungs are clear to auscultation, no crackles or wheezes. Heart:  Normal rate and regular rhythm. S1 and S2 normal without gallop, murmur, click, rub or other extra sounds. Abdomen:  Bowel sounds positive,abdomen soft and non-tender without masses, organomegaly or hernias noted. Msk:  No deformity or scoliosis noted of thoracic or  lumbar spine.   Extremities:  No clubbing, cyanosis, edema, or deformity noted with normal full range of motion of all joints.   Neurologic:  alert & oriented X3 and gait normal.   Skin:  Intact without suspicious lesions or rashes Psych:  Cognition and judgment appear intact. Alert and cooperative with normal attention span and concentration. No apparent delusions, illusions, hallucinations    Assessment and Plan:  1. Fatigue  Likely multifactorial.  Will check labs today to rule out any possible reversible causes. CBC with Differential, TSH  2. HYPERTENSION  Stable, likely elevated today due to understandable anxiety about biopsy results.  Advised to call me if her BP stays elevated. Refill meds at current dose. The patient indicates  understanding of these issues and agrees with the plan.

## 2012-07-24 ENCOUNTER — Other Ambulatory Visit: Payer: Self-pay | Admitting: Family Medicine

## 2012-08-07 ENCOUNTER — Ambulatory Visit: Payer: Self-pay | Admitting: Internal Medicine

## 2012-08-07 LAB — BASIC METABOLIC PANEL
BUN: 20 mg/dL — ABNORMAL HIGH (ref 7–18)
Calcium, Total: 9.3 mg/dL (ref 8.5–10.1)
Creatinine: 1.06 mg/dL (ref 0.60–1.30)
EGFR (African American): >60
EGFR (Non-African Amer.): 54 — ABNORMAL LOW
Glucose: 93 mg/dL (ref 65–99)
Sodium: 134 mmol/L — ABNORMAL LOW (ref 136–145)

## 2012-08-07 LAB — HEPATIC FUNCTION PANEL A (ARMC)
Albumin: 4 g/dL (ref 3.4–5.0)
Alkaline Phosphatase: 95 U/L (ref 50–136)
Bilirubin,Total: 0.3 mg/dL (ref 0.2–1.0)
SGPT (ALT): 48 U/L (ref 12–78)

## 2012-08-07 LAB — CBC CANCER CENTER
Basophil #: 0.1 x10 3/mm (ref 0.0–0.1)
Eosinophil #: 0.3 x10 3/mm (ref 0.0–0.7)
Eosinophil %: 3.2 %
HCT: 37.5 % (ref 35.0–47.0)
Lymphocyte #: 2.4 x10 3/mm (ref 1.0–3.6)
Lymphocyte %: 30.1 %
MCH: 29.7 pg (ref 26.0–34.0)
MCV: 90 fL (ref 80–100)
Monocyte #: 0.6 x10 3/mm (ref 0.2–0.9)
Platelet: 191 x10 3/mm (ref 150–440)
RBC: 4.15 10*6/uL (ref 3.80–5.20)
WBC: 8 x10 3/mm (ref 3.6–11.0)

## 2012-08-12 ENCOUNTER — Ambulatory Visit: Payer: Self-pay | Admitting: Internal Medicine

## 2012-08-20 ENCOUNTER — Telehealth: Payer: Self-pay

## 2012-08-20 MED ORDER — NYSTATIN & DIAPER RASH PRODUCT 100000 UNIT/GM EX KIT: PACK | CUTANEOUS | Status: DC

## 2012-08-20 NOTE — Addendum Note (Signed)
Addended by: Eliezer Bottom on: 08/20/2012 03:43 PM   Modules accepted: Orders

## 2012-08-20 NOTE — Telephone Encounter (Signed)
Pt request Nystatin 100,000 units Cream apply to affected area twice daily as needed; not on med list; last got from Dr Sullivan Lone; uses for perineal irritation or moisture from urinary leakage to [facility] [address].Please advise.

## 2012-08-20 NOTE — Telephone Encounter (Signed)
Ok to refill as requested 

## 2012-08-20 NOTE — Telephone Encounter (Signed)
Nystatin script changed to plain nystatin, not nystatin and diaper rash formula.  Advised pharmacy.

## 2012-11-26 ENCOUNTER — Other Ambulatory Visit: Payer: Self-pay | Admitting: Family Medicine

## 2013-02-09 ENCOUNTER — Encounter: Payer: Self-pay | Admitting: *Deleted

## 2013-02-13 ENCOUNTER — Ambulatory Visit (INDEPENDENT_AMBULATORY_CARE_PROVIDER_SITE_OTHER): Payer: Medicare Other | Admitting: Family Medicine

## 2013-02-13 ENCOUNTER — Encounter: Payer: Self-pay | Admitting: Family Medicine

## 2013-02-13 VITALS — BP 132/82 | HR 76 | Temp 98.2°F | Ht 58.5 in | Wt 139.0 lb

## 2013-02-13 DIAGNOSIS — E785 Hyperlipidemia, unspecified: Secondary | ICD-10-CM

## 2013-02-13 DIAGNOSIS — M1990 Unspecified osteoarthritis, unspecified site: Secondary | ICD-10-CM

## 2013-02-13 DIAGNOSIS — Z1331 Encounter for screening for depression: Secondary | ICD-10-CM

## 2013-02-13 DIAGNOSIS — N3946 Mixed incontinence: Secondary | ICD-10-CM

## 2013-02-13 DIAGNOSIS — Z1211 Encounter for screening for malignant neoplasm of colon: Secondary | ICD-10-CM

## 2013-02-13 DIAGNOSIS — I10 Essential (primary) hypertension: Secondary | ICD-10-CM

## 2013-02-13 DIAGNOSIS — Z0000 Encounter for general adult medical examination without abnormal findings: Secondary | ICD-10-CM

## 2013-02-13 LAB — CBC WITH DIFFERENTIAL/PLATELET
Eosinophils Relative: 4.4 % (ref 0.0–5.0)
HCT: 38.5 % (ref 36.0–46.0)
Hemoglobin: 13 g/dL (ref 12.0–15.0)
Lymphs Abs: 2.5 10*3/uL (ref 0.7–4.0)
Monocytes Relative: 7.7 % (ref 3.0–12.0)
Platelets: 206 10*3/uL (ref 150.0–400.0)
WBC: 5.8 10*3/uL (ref 4.5–10.5)

## 2013-02-13 LAB — LIPID PANEL
HDL: 46.1 mg/dL (ref >39.00)
Total CHOL/HDL Ratio: 4

## 2013-02-13 LAB — COMPREHENSIVE METABOLIC PANEL
ALT: 32 U/L (ref 0–35)
AST: 29 U/L (ref 0–37)
Albumin: 4.2 g/dL (ref 3.5–5.2)
Calcium: 9.5 mg/dL (ref 8.4–10.5)
Chloride: 99 mEq/L (ref 96–112)
Potassium: 4.2 mEq/L (ref 3.5–5.1)
Sodium: 140 mEq/L (ref 135–145)
Total Protein: 7.1 g/dL (ref 6.0–8.3)

## 2013-02-13 MED ORDER — MIRABEGRON ER 25 MG PO TB24: 25.0000 mg | ORAL_TABLET | Freq: Every day | ORAL | Status: DC

## 2013-02-13 MED ORDER — DICLOFENAC SODIUM 75 MG PO TBEC: DELAYED_RELEASE_TABLET | ORAL | Status: DC

## 2013-02-13 MED ORDER — HYDROCHLOROTHIAZIDE 25 MG PO TABS: 25.0000 mg | ORAL_TABLET | Freq: Every day | ORAL | Status: DC

## 2013-02-13 MED ORDER — METOPROLOL SUCCINATE ER 50 MG PO TB24: 50.0000 mg | ORAL_TABLET | Freq: Every day | ORAL | Status: DC

## 2013-02-13 NOTE — Addendum Note (Signed)
Addended by: Alvina Chou on: 02/13/2013 01:20 PM   Modules accepted: Orders

## 2013-02-13 NOTE — Progress Notes (Signed)
67 yo here for annual medicare wellness visit.  Doing really well.  Her son is getting married in April!  h/o breast CA- diagnosed April 2010, s/p lumpectomy, radiation and chemotherapy ( Paclitaxel x 12 weeks, herceptin x 1 year).  Doing well.  Followed by Dr. Sherrlyn Hock every six months.  Had mammogram last year.Dr. Doristine Counter orders that.  No residual side effects from the chemotherapy or radiation therapy. Rad onc is Dr. Rushie Chestnut.  HTN- gets white coat HTN.  On Metoprolol 50 mg daily and HCTZ 12.5 mg daily.  No HA, blurred vision, CP, SOB or LE edema.  Well woman- DEXA scan excellent bone density, 12/2010.  Dr. Doristine Counter orders these.     Mixed urinary incontinence- Started Ditropan last year which has helped significantly but she has severe dry mouth and constipation.  HLD- on Zocor 20 mg daily daily.  Denies myalgias.  Lab Results  Component Value Date   CHOL 195 01/05/2012   HDL 45.40 01/05/2012   LDLCALC 111* 01/05/2012   LDLDIRECT 112.8 02/14/2011   TRIG 193.0* 01/05/2012   CHOLHDL 4 01/05/2012    I have personally reviewed the Medicare Annual Wellness questionnaire and have noted 1. The patient's medical and social history 2. Their use of alcohol, tobacco or illicit drugs 3. Their current medications and supplements 4. The patient's functional ability including ADL's, fall risks, home safety risks and hearing or visual             impairment. 5. Diet and physical activities 6. Evidence for depression or mood disorders  ROS: See HPI Patient reports no  vision/ hearing changes,anorexia, weight change, fever ,adenopathy, persistant / recurrent hoarseness, swallowing issues, chest pain, edema,persistant / recurrent cough, hemoptysis, dyspnea(rest, exertional, paroxysmal nocturnal), gastrointestinal  bleeding (melena, rectal bleeding), abdominal pain, excessive heart burn,  syncope, focal weakness, severe memory loss, concerning skin lesions, depression, anxiety, abnormal  bruising/bleeding, major joint swelling, breast masses or abnormal vaginal bleeding.    Physical exam: BP 132/82  Pulse 76  Temp(Src) 98.2 F (36.8 C) (Oral)  Ht 4' 10.5" (1.486 m)  Wt 139 lb (63.05 kg)  BMI 28.55 kg/m2  SpO2 98%  General:  Well-developed,well-nourished,in no acute distress; alert,appropriate and cooperative throughout examination Head:  normocephalic and atraumatic.   Eyes:  vision grossly intact, pupils equal, pupils round, and pupils reactive to light.   Ears:  R ear normal and L ear normal.   Nose:  no external deformity.   Mouth:  good dentition.   Lungs:  Normal respiratory effort, chest expands symmetrically. Lungs are clear to auscultation, no crackles or wheezes. Heart:  Normal rate and regular rhythm. S1 and S2 normal without gallop, murmur, click, rub or other extra sounds. Abdomen:  Bowel sounds positive,abdomen soft and non-tender without masses, organomegaly or hernias noted. Msk:  No deformity or scoliosis noted of thoracic or lumbar spine.   Extremities:  No clubbing, cyanosis, edema, or deformity noted with normal full range of motion of all joints.   Neurologic:  alert & oriented X3 and gait normal.   Skin:  Intact without suspicious lesions or rashes Psych:  Cognition and judgment appear intact. Alert and cooperative with normal attention span and concentration. No apparent delusions, illusions, hallucinations    Assessment and Plan: 1. Routine general medical examination at a health care facility  The patients weight, height, BMI and visual acuity have been recorded in the chart I have made referrals, counseling and provided education to the patient based review of the above  and I have provided the pt with a written personalized care plan for preventive services.  Orders Placed This Encounter  Procedures  . Fecal occult blood, imunochemical  . Comprehensive metabolic panel  . Lipid Panel  . CBC with Differential     2. Mixed incontinence   Improved but having significant anticholingeric side effects.  Will d/c ditropan.  Start Myrbetriq. Discussed avoiding bladder irritants as well.  3. HYPERTENSION  Well controlled on current meds.   4. BREAST CANCER, HX OF  Stable, per oncology  5. COLONIC POLYPS, HX OF  IFOB

## 2013-02-13 NOTE — Patient Instructions (Addendum)
Great to see you and congratulations on your new daughter in law! Please STOP taking the Ditropan and start taking Myrbetriq.  We will call you with your lab results.  Please mention your bone density scan at your next appointment with Dr. Doristine Counter.

## 2013-02-14 ENCOUNTER — Telehealth: Payer: Self-pay

## 2013-02-14 MED ORDER — OXYBUTYNIN CHLORIDE ER 5 MG PO TB24: 5.0000 mg | ORAL_TABLET | Freq: Every day | ORAL | Status: DC

## 2013-02-14 NOTE — Telephone Encounter (Signed)
Unfortunately, there are no other options other than restarting the oxybutynin.  Yes she needs to continue simvastatin.  I'm sorry to hear the copay was so high for the mirabegron.

## 2013-02-14 NOTE — Telephone Encounter (Signed)
Left message asking patient to call back

## 2013-02-14 NOTE — Telephone Encounter (Signed)
Pt went to pick up Mirabegron ER and co pay was $80. Pt did not pick up med and wanted to know if there is another med that can be substituted which is more affordable or should pt go back on Oxybutynin ER. Pt wants to know if needs to continue Simvastatin; pt thinks this is for urine leakage; I advised Simvastatin is for lowering cholesterol.Please advise. [facility] [address]

## 2013-02-14 NOTE — Telephone Encounter (Signed)
Advised patient as instructed.  Refill on ox[facility]butynin sent to [facility].

## 2013-03-05 ENCOUNTER — Encounter: Payer: Self-pay | Admitting: *Deleted

## 2013-03-05 ENCOUNTER — Other Ambulatory Visit (INDEPENDENT_AMBULATORY_CARE_PROVIDER_SITE_OTHER): Payer: Medicare Other

## 2013-03-05 DIAGNOSIS — Z1211 Encounter for screening for malignant neoplasm of colon: Secondary | ICD-10-CM

## 2013-03-05 LAB — FECAL OCCULT BLOOD, IMMUNOCHEMICAL: Fecal Occult Bld: NEGATIVE

## 2013-03-28 ENCOUNTER — Ambulatory Visit: Payer: Self-pay | Admitting: General Surgery

## 2013-04-01 ENCOUNTER — Encounter: Payer: Self-pay | Admitting: *Deleted

## 2013-04-01 ENCOUNTER — Encounter: Payer: Self-pay | Admitting: General Surgery

## 2013-04-02 ENCOUNTER — Other Ambulatory Visit: Payer: Self-pay | Admitting: Family Medicine

## 2013-04-23 ENCOUNTER — Encounter: Payer: Self-pay | Admitting: General Surgery

## 2013-04-23 ENCOUNTER — Ambulatory Visit (INDEPENDENT_AMBULATORY_CARE_PROVIDER_SITE_OTHER): Payer: Medicare Other | Admitting: General Surgery

## 2013-04-23 ENCOUNTER — Other Ambulatory Visit: Payer: Self-pay | Admitting: *Deleted

## 2013-04-23 VITALS — BP 128/72 | HR 72 | Resp 14 | Ht 58.5 in | Wt 139.0 lb

## 2013-04-23 DIAGNOSIS — Z853 Personal history of malignant neoplasm of breast: Secondary | ICD-10-CM

## 2013-04-23 DIAGNOSIS — C50919 Malignant neoplasm of unspecified site of unspecified female breast: Secondary | ICD-10-CM

## 2013-04-23 DIAGNOSIS — C50912 Malignant neoplasm of unspecified site of left female breast: Secondary | ICD-10-CM

## 2013-04-23 NOTE — Patient Instructions (Addendum)
Patient to return in 1 year with bilateral diagnostic mammogram.  

## 2013-04-23 NOTE — Progress Notes (Signed)
Patient ID: Kathleen Jones, female   DOB: December 25, 1945, 67 y.o.   MRN: [id number]  Chief Complaint  Patient presents with  . Follow-up    mammogram    HPI Kathleen Jones is a 67 y.o. female.  Patient here today for follow up mammogram. She has a known history of a wide local excision and sentinel node biopsy her for left breast cancer in April 03, 2009. 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 Sentinel nodes were negative. She underwent adjuvant chemotherapy followed by whole breast radiation. Her power port was removed after completion of Herceptin therapy in May 2011. She had a FNA right breast April 2013 that was benign. HPI  Past Medical History  Diagnosis Date  . Cancer 2010     wide local excision and sentinel node biopsy her for left breast cancer in April 03, 2009. 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 Sentinel nodes were negative  . High cholesterol   . Personal history of malignant neoplasm of breast 2010  . Arthritis   . Hypertension     Past Surgical History  Procedure Laterality Date  . Breast lumpectomy Left 2010     wide local excision and sentinel node biopsy her for left breast cancer in April 03, 2009. 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 Sentinel nodes were negative  . Colonoscopy  2011    dR. eLLIOTT  . Tonsillectomy    . Abdominal hysterectomy  1983  . Abdominoplasty  1999  . Power port placement   2010  . Mm breast stereo bx*l*r/s Left 2010    Family History  Problem Relation Age of Onset  . Breast cancer Paternal Aunt   . Ovarian cancer    . Colon cancer      Social History History  Substance Use Topics  . Smoking status: Former Games developer  . Smokeless tobacco: Not on file     Comment: quit 1983  . Alcohol Use: Yes     Comment:  occassionally    Allergies  Allergen Reactions  . Zolpidem Tartrate     REACTION: hallucinations    Current Outpatient Prescriptions  Medication Sig Dispense Refill  . aspirin 81 MG tablet Take 160 mg by mouth daily.      . Cholecalciferol (VITAMIN D3) 1000 UNITS CAPS Take by mouth daily.      . diclofenac (VOLTAREN) 75 MG EC tablet TAKE ONE TABLET BY MOUTH TWICE DAILY  180 tablet  0  . docusate sodium (COLACE) 100 MG capsule Take 100 mg by mouth as needed for constipation.      . hydrochlorothiazide (HYDRODIURIL) 25 MG tablet Take 1 tablet (25 mg total) by mouth daily.  90 tablet  3  . metoprolol succinate (TOPROL-XL) 50 MG 24 hr tablet Take 1 tablet (50 mg total) by mouth daily.  90 tablet  3  . milk thistle 175 MG tablet Take 175 mg by mouth daily.      . mirabegron ER (MYRBETRIQ) 25 MG TB24 Take 1 tablet (25 mg total) by mouth daily.  90 tablet  3  . MULTIPLE VITAMIN PO Take by mouth daily.      Marland Kitchen nystatin cream (MYCOSTATIN) Apply to affected area twice daily as needed.      Marland Kitchen oxybutynin (DITROPAN-XL) 5 MG 24 hr tablet Take 1 tablet (5 mg total) by mouth daily.  90 tablet  1  . simvastatin (ZOCOR) 20 MG tablet TAKE ONE TABLET BY MOUTH AT BEDTIME  90 tablet  1  . triamcinolone cream (KENALOG) 0.1 % Apply topically as needed.       No current facility-administered medications for this visit.    Review of Systems Review of Systems  Constitutional: Negative.   Respiratory: Negative.   Cardiovascular: Negative.     Blood pressure 128/72, pulse 72, resp. rate 14, height 4' 10.5" (1.486 m), weight 139 lb (63.05 kg).  Physical Exam Physical Exam  Constitutional: She appears well-developed and well-nourished.  Neck: Trachea normal. No mass and no thyromegaly present.  Cardiovascular: Normal rate, regular rhythm, normal heart sounds and normal pulses.   No murmur heard. Pulmonary/Chest: Effort normal and breath sounds normal. Right breast exhibits no inverted nipple, no mass, no  nipple discharge, no skin change and no tenderness. Left breast exhibits no inverted nipple, no mass, no nipple discharge, no skin change and no tenderness.  Lymphadenopathy:    She has no cervical adenopathy.    She has no axillary adenopathy.    Data Reviewed Bilateral mammograms that 03/28/2013 were reviewed. Postoperative changes are appreciated. BI-RAD-2.  Assessment    No evidence of recurrent disease.    Plan    Arrangements will be made for followup bilateral mammogram with office visit in one year.       Kathleen Jones 04/24/2013, 6:37 AM

## 2013-04-23 NOTE — Progress Notes (Signed)
The patient has been asked to return to the office in one year for a bilateral diagnostic mammogram. 

## 2013-04-24 ENCOUNTER — Encounter: Payer: Self-pay | Admitting: General Surgery

## 2013-08-05 ENCOUNTER — Ambulatory Visit: Payer: Self-pay | Admitting: Internal Medicine

## 2013-10-11 ENCOUNTER — Other Ambulatory Visit: Payer: Self-pay | Admitting: Family Medicine

## 2013-10-11 MED ORDER — SIMVASTATIN 20 MG PO TABS: ORAL_TABLET | ORAL | Status: DC

## 2013-11-26 ENCOUNTER — Encounter: Payer: Medicare Other | Admitting: Family Medicine

## 2013-11-26 ENCOUNTER — Encounter: Payer: Self-pay | Admitting: Internal Medicine

## 2013-11-26 ENCOUNTER — Other Ambulatory Visit: Payer: Self-pay | Admitting: Family Medicine

## 2013-11-26 ENCOUNTER — Ambulatory Visit (INDEPENDENT_AMBULATORY_CARE_PROVIDER_SITE_OTHER): Payer: Medicare Other | Admitting: Internal Medicine

## 2013-11-26 VITALS — BP 136/84 | HR 84 | Temp 98.6°F | Wt 149.8 lb

## 2013-11-26 DIAGNOSIS — J069 Acute upper respiratory infection, unspecified: Secondary | ICD-10-CM

## 2013-11-26 MED ORDER — HYDROCODONE-HOMATROPINE 5-1.5 MG/5ML PO SYRP: 5.0000 mL | ORAL_SOLUTION | Freq: Three times a day (TID) | ORAL | Status: DC | PRN

## 2013-11-26 NOTE — Patient Instructions (Signed)

## 2013-11-26 NOTE — Telephone Encounter (Signed)
Pt got to pharmacy and RX was not signed.  She is out of the office.  RX printed again for Dr. Para March to sign for pt.

## 2013-11-26 NOTE — Progress Notes (Signed)
HPI  Pt presents to the clinic today with c/o cough. This started about 3 days ago. She also c/o fever, chest congestion and body aches. The cough is unproductive. She has tried zicam OTC with some relief. She has no history of allergies or asthma. She has not had sick contacts that she is aware of.  Review of Systems      Past Medical History  Diagnosis Date  . Cancer 2010     wide local excision and sentinel node biopsy her for left breast cancer in April 03, 2009. 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 Sentinel nodes were negative  . High cholesterol   . Personal history of malignant neoplasm of breast 2010  . Arthritis   . Hypertension     Family History  Problem Relation Age of Onset  . Breast cancer Paternal Aunt   . Ovarian cancer    . Colon cancer      History   Social History  . Marital Status: Married    Spouse Name: N/A    Number of Children: N/A  . Years of Education: N/A   Occupational History  . Not on file.   Social History Main Topics  . Smoking status: Former Games developer  . Smokeless tobacco: Not on file     Comment: quit 1983  . Alcohol Use: Yes     Comment: occassionally  . Drug Use: No  . Sexual Activity: Not on file   Other Topics Concern  . Not on file   Social History Narrative  . No narrative on file    Allergies  Allergen Reactions  . Zolpidem Tartrate     REACTION: hallucinations     Constitutional: Positive headache, fatigue and fever. Denies abrupt weight changes.  HEENT:  Positive sore throat. Denies eye redness, eye pain, pressure behind the eyes, facial pain, nasal congestion, ear pain, ringing in the ears, wax buildup, runny nose or bloody nose. Respiratory: Positive cough. Denies difficulty breathing or shortness of breath.  Cardiovascular: Denies chest pain, chest tightness, palpitations or swelling in the hands or feet.   No other specific complaints  in a complete review of systems (except as listed in HPI above).  Objective:   BP 136/84  Pulse 84  Temp(Src) 98.6 F (37 C) (Oral)  Wt 149 lb 12 oz (67.926 kg)  SpO2 97% Wt Readings from Last 3 Encounters:  11/26/13 149 lb 12 oz (67.926 kg)  04/23/13 139 lb (63.05 kg)  02/13/13 139 lb (63.05 kg)     General: Appears her stated age, well developed, well nourished in NAD. HEENT: Head: normal shape and size; Eyes: sclera white, no icterus, conjunctiva pink, PERRLA and EOMs intact; Ears: Tm's gray and intact, normal light reflex; Nose: mucosa pink and moist, septum midline; Throat/Mouth: + PND. Teeth present, mucosa erythematous and moist, no exudate noted, no lesions or ulcerations noted.  Neck: Mild cervical lymphadenopathy. Neck supple, trachea midline. No massses, lumps or thyromegaly present.  Cardiovascular: Normal rate and rhythm. S1,S2 noted.  No murmur, rubs or gallops noted. No JVD or BLE edema. No carotid bruits noted. Pulmonary/Chest: Normal effort and positive vesicular breath sounds. No respiratory distress. No wheezes, rales or ronchi noted.      Assessment & Plan:  Viral URI with cough:   Get some rest and drink plenty of water Do salt water gargles for the sore throat eRx for Hycodan cough syrup If no better by Friday, call  back and l will call you in a z pack  RTC as needed or if symptoms persist.

## 2013-11-26 NOTE — Progress Notes (Signed)
Pre-visit discussion using our clinic review tool. No additional management support is needed unless otherwise documented below in the visit note.  

## 2013-11-28 ENCOUNTER — Other Ambulatory Visit: Payer: Self-pay | Admitting: Internal Medicine

## 2013-11-28 ENCOUNTER — Telehealth: Payer: Self-pay

## 2013-11-28 MED ORDER — AZITHROMYCIN 250 MG PO TABS: ORAL_TABLET | ORAL | Status: DC

## 2013-11-28 NOTE — Telephone Encounter (Signed)
zpack sent in

## 2013-11-28 NOTE — Telephone Encounter (Signed)
Pt left v/m;pt seen 11/26/13; pt feels slightly better but cough is worse and pt request Z pak discussed at visit to [facility] [address].

## 2013-11-29 NOTE — Telephone Encounter (Signed)
Spoke to pt who states that she has received medication from pharmacy and is feeling slightly better.

## 2014-02-07 ENCOUNTER — Other Ambulatory Visit: Payer: Self-pay | Admitting: Family Medicine

## 2014-02-07 DIAGNOSIS — I10 Essential (primary) hypertension: Secondary | ICD-10-CM

## 2014-02-07 DIAGNOSIS — E785 Hyperlipidemia, unspecified: Secondary | ICD-10-CM

## 2014-02-07 DIAGNOSIS — Z0000 Encounter for general adult medical examination without abnormal findings: Secondary | ICD-10-CM

## 2014-02-10 ENCOUNTER — Telehealth: Payer: Self-pay | Admitting: Family Medicine

## 2014-02-10 ENCOUNTER — Other Ambulatory Visit (INDEPENDENT_AMBULATORY_CARE_PROVIDER_SITE_OTHER): Payer: Medicare Other

## 2014-02-10 DIAGNOSIS — I10 Essential (primary) hypertension: Secondary | ICD-10-CM

## 2014-02-10 DIAGNOSIS — Z0000 Encounter for general adult medical examination without abnormal findings: Secondary | ICD-10-CM

## 2014-02-10 DIAGNOSIS — E785 Hyperlipidemia, unspecified: Secondary | ICD-10-CM

## 2014-02-10 LAB — CBC WITH DIFFERENTIAL/PLATELET
BASOS PCT: 0.5 % (ref 0.0–3.0)
Basophils Absolute: 0 10*3/uL (ref 0.0–0.1)
EOS ABS: 0.3 10*3/uL (ref 0.0–0.7)
EOS PCT: 4.6 % (ref 0.0–5.0)
HEMATOCRIT: 39.5 % (ref 36.0–46.0)
Hemoglobin: 12.9 g/dL (ref 12.0–15.0)
LYMPHS ABS: 2 10*3/uL (ref 0.7–4.0)
Lymphocytes Relative: 31.6 % (ref 12.0–46.0)
MCHC: 32.6 g/dL (ref 30.0–36.0)
MCV: 90 fl (ref 78.0–100.0)
MONO ABS: 0.5 10*3/uL (ref 0.1–1.0)
Monocytes Relative: 8.4 % (ref 3.0–12.0)
Neutro Abs: 3.5 10*3/uL (ref 1.4–7.7)
Neutrophils Relative %: 54.9 % (ref 43.0–77.0)
Platelets: 219 10*3/uL (ref 150.0–400.0)
RBC: 4.39 Mil/uL (ref 3.87–5.11)
RDW: 14.2 % (ref 11.5–14.6)
WBC: 6.4 10*3/uL (ref 4.5–10.5)

## 2014-02-10 LAB — COMPREHENSIVE METABOLIC PANEL
ALT: 32 U/L (ref 0–35)
AST: 25 U/L (ref 0–37)
Albumin: 3.9 g/dL (ref 3.5–5.2)
Alkaline Phosphatase: 65 U/L (ref 39–117)
BILIRUBIN TOTAL: 0.4 mg/dL (ref 0.3–1.2)
BUN: 21 mg/dL (ref 6–23)
CO2: 31 mEq/L (ref 19–32)
Calcium: 9.7 mg/dL (ref 8.4–10.5)
Chloride: 101 mEq/L (ref 96–112)
Creatinine, Ser: 0.9 mg/dL (ref 0.4–1.2)
GFR: 70.55 mL/min (ref >60.00)
GLUCOSE: 100 mg/dL — AB (ref 70–99)
Potassium: 3.9 mEq/L (ref 3.5–5.1)
SODIUM: 139 meq/L (ref 135–145)
TOTAL PROTEIN: 7 g/dL (ref 6.0–8.3)

## 2014-02-10 LAB — LIPID PANEL
CHOL/HDL RATIO: 4
Cholesterol: 193 mg/dL (ref 0–200)
HDL: 44.5 mg/dL (ref >39.00)
LDL CALC: 104 mg/dL — AB (ref 0–99)
Triglycerides: 222 mg/dL — ABNORMAL HIGH (ref 0.0–149.0)
VLDL: 44.4 mg/dL — ABNORMAL HIGH (ref 0.0–40.0)

## 2014-02-10 LAB — TSH: TSH: 2.31 u[IU]/mL (ref 0.35–5.50)

## 2014-02-10 NOTE — Telephone Encounter (Signed)
Relevant patient education assigned to patient using Emmi. ° °

## 2014-02-17 ENCOUNTER — Ambulatory Visit (INDEPENDENT_AMBULATORY_CARE_PROVIDER_SITE_OTHER)
Admission: RE | Admit: 2014-02-17 | Discharge: 2014-02-17 | Disposition: A | Discharge status: Home / Self Care | Payer: Medicare Other | Source: Ambulatory Visit | Attending: Family Medicine | Admitting: Family Medicine

## 2014-02-17 ENCOUNTER — Ambulatory Visit (INDEPENDENT_AMBULATORY_CARE_PROVIDER_SITE_OTHER): Payer: Medicare Other | Admitting: Family Medicine

## 2014-02-17 ENCOUNTER — Encounter: Payer: Self-pay | Admitting: Family Medicine

## 2014-02-17 VITALS — BP 146/94 | HR 70 | Temp 98.1°F | Ht <= 58 in | Wt 153.0 lb

## 2014-02-17 DIAGNOSIS — E785 Hyperlipidemia, unspecified: Secondary | ICD-10-CM

## 2014-02-17 DIAGNOSIS — R0609 Other forms of dyspnea: Secondary | ICD-10-CM

## 2014-02-17 DIAGNOSIS — R0989 Other specified symptoms and signs involving the circulatory and respiratory systems: Secondary | ICD-10-CM

## 2014-02-17 DIAGNOSIS — Z86010 Personal history of colonic polyps: Secondary | ICD-10-CM

## 2014-02-17 DIAGNOSIS — N3946 Mixed incontinence: Secondary | ICD-10-CM

## 2014-02-17 DIAGNOSIS — Z0000 Encounter for general adult medical examination without abnormal findings: Secondary | ICD-10-CM

## 2014-02-17 DIAGNOSIS — Z853 Personal history of malignant neoplasm of breast: Secondary | ICD-10-CM

## 2014-02-17 MED ORDER — NYSTATIN 100000 UNIT/GM EX CREA: 1.0000 "application " | TOPICAL_CREAM | Freq: Two times a day (BID) | CUTANEOUS | Status: AC

## 2014-02-17 NOTE — Progress Notes (Signed)
68 yo here for annual medicare wellness visit. I have personally reviewed the Medicare Annual Wellness questionnaire and have noted 1. The patient's medical and social history 2. Their use of alcohol, tobacco or illicit drugs 3. Their current medications and supplements 4. The patient's functional ability including ADL's, fall risks, home safety risks and hearing or visual             impairment. 5. Diet and physical activities 6. Evidence for depression or mood disorders  End of life wishes discussed and updated in Social History.  Last colonoscopy 08/2010- Dr. Tiffany Kocher.  DOE-  Past month or two, she has noticed some DOE. No CP.  No worsening edema.  Does have a cough, no hemoptysis.   h/o breast CA- diagnosed April 2010, s/p lumpectomy, radiation and chemotherapy ( Paclitaxel x 12 weeks, herceptin x 1 year).  Doing well.  Followed by Dr. Ma Hillock every year.  Had mammogram last year.Dr. Tollie Pizza orders that.  No residual side effects from the chemotherapy or radiation therapy. Rad onc is Dr. Baruch Gouty.  HTN- gets white coat HTN.  On Metoprolol 50 mg daily and HCTZ 12.5 mg daily.  No HA, blurred vision, CP, SOB or LE edema. Lab Results  Component Value Date   CREATININE 0.9 02/10/2014    Well woman- DEXA scan excellent bone density, 12/2010.  Dr. Tollie Pizza orders these.     Mixed urinary incontinence- worsening.  Now leaking all the time.  On oxybutinin- we tried myrbetiq but she could not afford it. Has dry mouth with oxybutinin.  Remote h/o hysterectomy.  HLD- on Zocor 20 mg daily daily.  Denies myalgias.  Lab Results  Component Value Date   CHOL 193 02/10/2014   HDL 44.50 02/10/2014   LDLCALC 104* 02/10/2014   LDLDIRECT 112.8 02/14/2011   TRIG 222.0* 02/10/2014   CHOLHDL 4 02/10/2014    Patient Active Problem List   Diagnosis Date Noted  . DOE (dyspnea on exertion) 02/17/2014  . Fatigue 04/04/2012  . Routine general medical examination at a health care facility 01/09/2012  . Mixed  incontinence 01/09/2012  . HYPERLIPIDEMIA 02/14/2011  . HYPERTENSION 12/20/2010  . OSTEOARTHRITIS 12/20/2010  . BREAST CANCER, HX OF 12/20/2010  . COLONIC POLYPS, HX OF 12/20/2010   Past Medical History  Diagnosis Date  . Cancer 2010     wide local excision and sentinel node biopsy her for left breast cancer in April 03, 2009. 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 Sentinel nodes were negative  . High cholesterol   . Personal history of malignant neoplasm of breast 2010  . Arthritis   . Hypertension    Past Surgical History  Procedure Laterality Date  . Breast lumpectomy Left 2010     wide local excision and sentinel node biopsy her for left breast cancer in April 03, 2009. 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 Sentinel nodes were negative  . Colonoscopy  2011    dR. eLLIOTT  . Tonsillectomy    . Abdominal hysterectomy  1983  . Abdominoplasty  1999  . Power port placement   2010  . Mm breast stereo bx*l*r/s Left 2010   History  Substance Use Topics  . Smoking status: Former Research scientist (life sciences)  . Smokeless tobacco: Not on file     Comment: quit 1983  . Alcohol Use: Yes     Comment: occassionally   Family History  Problem Relation Age of  Onset  . Breast cancer Paternal Aunt   . Ovarian cancer    . Colon cancer     Allergies  Allergen Reactions  . Zolpidem Tartrate     REACTION: hallucinations   Current Outpatient Prescriptions on File Prior to Visit  Medication Sig Dispense Refill  . aspirin 81 MG tablet Take 160 mg by mouth daily.      . diclofenac (VOLTAREN) 75 MG EC tablet TAKE ONE TABLET BY MOUTH TWICE DAILY  180 tablet  0  . docusate sodium (COLACE) 100 MG capsule Take 100 mg by mouth as needed for constipation.      . metoprolol succinate (TOPROL-XL) 50 MG 24 hr tablet Take 1 tablet (50 mg total) by mouth daily.  90 tablet  3   . milk thistle 175 MG tablet Take 175 mg by mouth daily.      Marland Kitchen oxybutynin (DITROPAN-XL) 5 MG 24 hr tablet Take 1 tablet (5 mg total) by mouth daily.  90 tablet  1  . simvastatin (ZOCOR) 20 MG tablet TAKE ONE TABLET BY MOUTH AT BEDTIME **Needs follow-up appt before future refills are given**  90 tablet  1   No current facility-administered medications on file prior to visit.   The PMH, PSH, Social History, Family History, Medications, and allergies have been reviewed in [facility], and have been updated if relevant.   ROS: See HPI Patient reports no  vision/ hearing changes,anorexia, weight change, fever ,adenopathy, persistant / recurrent hoarseness, swallowing issues, chest pain, hemoptysis,, gastrointestinal  bleeding (melena, rectal bleeding), abdominal pain, excessive heart burn,  syncope, focal weakness, severe memory loss, concerning skin lesions, depression, anxiety, abnormal bruising/bleeding, major joint swelling, breast masses or abnormal vaginal bleeding.    Physical exam: BP 146/94  Pulse 70  Temp(Src) 98.1 F (36.7 C) (Oral)  Ht 4' 9.75" (1.467 m)  Wt 153 lb (69.4 kg)  BMI 32.25 kg/m2  SpO2 98%  Wt Readings from Last 3 Encounters:  02/17/14 153 lb (69.4 kg)  11/26/13 149 lb 12 oz (67.926 kg)  04/23/13 139 lb (63.05 kg)    General:  Well-developed,well-nourished,in no acute distress; alert,appropriate and cooperative throughout examination Head:  normocephalic and atraumatic.   Eyes:  vision grossly intact, pupils equal, pupils round, and pupils reactive to light.   Ears:  R ear normal and L ear normal.   Nose:  no external deformity.   Mouth:  good dentition.   Lungs:  Normal respiratory effort, chest expands symmetrically. Lungs are clear to auscultation, no crackles or wheezes. Heart:  Normal rate and regular rhythm. S1 and S2 normal without gallop, murmur, click, rub or other extra sounds. Abdomen:  Bowel sounds positive,abdomen soft and non-tender without masses,  organomegaly or hernias noted. Msk:  No deformity or scoliosis noted of thoracic or lumbar spine.   Extremities:  No clubbing, cyanosis, edema, or deformity noted with normal full range of motion of all joints.   Neurologic:  alert & oriented X3 and gait normal.   Skin:  Intact without suspicious lesions or rashes Psych:  Cognition and judgment appear intact. Alert and cooperative with normal attention span and concentration. No apparent delusions, illusions, hallucinations    Assessment and Plan:

## 2014-02-17 NOTE — Assessment & Plan Note (Signed)
Well controlled. No changes. 

## 2014-02-17 NOTE — Assessment & Plan Note (Signed)
Lung clear on exam. Will get CXR today. If neg- refer to cards for echo/stress test. The patient indicates understanding of these issues and agrees with the plan.

## 2014-02-17 NOTE — Addendum Note (Signed)
Addended by: Lucille Passy on: 02/17/2014 01:16 PM   Modules accepted: Orders

## 2014-02-17 NOTE — Progress Notes (Signed)
Pre visit review using our clinic review tool, if applicable. No additional management support is needed unless otherwise documented below in the visit note. 

## 2014-02-17 NOTE — Addendum Note (Signed)
Addended by: Lucille Passy on: 02/17/2014 02:59 PM   Modules accepted: Orders

## 2014-02-17 NOTE — Assessment & Plan Note (Signed)
The patients weight, height, BMI and visual acuity have been recorded in the chart I have made referrals, counseling and provided education to the patient based review of the above and I have provided the pt with a written personalized care plan for preventive services.  

## 2014-02-17 NOTE — Patient Instructions (Signed)
Please stop taking your oxybutynin. We will call you with a referral to urogynecology.  I will call you with your chest xray results today.  Your labs are fantastic!

## 2014-02-17 NOTE — Assessment & Plan Note (Signed)
Deteriorating. D/c oxybutinin as not helping. Refer to urogyn to discuss surgical options. The patient indicates understanding of these issues and agrees with the plan.

## 2014-02-18 ENCOUNTER — Telehealth: Payer: Self-pay

## 2014-02-18 DIAGNOSIS — R0609 Other forms of dyspnea: Principal | ICD-10-CM

## 2014-02-18 NOTE — Telephone Encounter (Signed)
I canceled the referral but will place another one now. Thanks.

## 2014-02-18 NOTE — Telephone Encounter (Signed)
Mr Freund said pt was notified about chest xray on 02/17/14 and was advised needed to see cardiologist; pt declined. After pt talking with her husband pt has decided to see cardiologist.Please advise.

## 2014-02-26 ENCOUNTER — Other Ambulatory Visit: Payer: Self-pay | Admitting: *Deleted

## 2014-02-26 ENCOUNTER — Other Ambulatory Visit: Payer: Self-pay | Admitting: Family Medicine

## 2014-02-26 MED ORDER — METOPROLOL SUCCINATE ER 50 MG PO TB24: 50.0000 mg | ORAL_TABLET | Freq: Every day | ORAL | Status: DC

## 2014-02-26 MED ORDER — DICLOFENAC SODIUM 75 MG PO TBEC: DELAYED_RELEASE_TABLET | ORAL | Status: DC

## 2014-02-26 NOTE — Telephone Encounter (Signed)
Last filled 02/13/13.  Ok to refill?

## 2014-03-03 ENCOUNTER — Encounter: Payer: Self-pay | Admitting: Cardiovascular Disease

## 2014-03-03 ENCOUNTER — Ambulatory Visit (INDEPENDENT_AMBULATORY_CARE_PROVIDER_SITE_OTHER): Payer: Medicare Other | Admitting: Cardiovascular Disease

## 2014-03-03 VITALS — BP 158/90 | HR 85 | Ht <= 58 in | Wt 152.0 lb

## 2014-03-03 DIAGNOSIS — I10 Essential (primary) hypertension: Secondary | ICD-10-CM

## 2014-03-03 DIAGNOSIS — R0989 Other specified symptoms and signs involving the circulatory and respiratory systems: Secondary | ICD-10-CM

## 2014-03-03 DIAGNOSIS — R0609 Other forms of dyspnea: Secondary | ICD-10-CM

## 2014-03-03 DIAGNOSIS — R0789 Other chest pain: Secondary | ICD-10-CM

## 2014-03-03 MED ORDER — LISINOPRIL 10 MG PO TABS: 10.0000 mg | ORAL_TABLET | Freq: Every day | ORAL | Status: DC

## 2014-03-03 NOTE — Assessment & Plan Note (Signed)
This he presents today for further evaluation of shortness of breath and chest discomfort with exertion. She has been having difficulty carrying her grandchild around and would bring groceries in from a car. She has several risk factors for coronary artery disease including hypertension, hyperlipidemia, and history of cigarette smoking. I've like to get a stress Myoview study for further evaluation of her coronary disease. We'll also get an echocardiogram for further evaluation of her left ventricular function. She has a history of leg edema as well.

## 2014-03-03 NOTE — Assessment & Plan Note (Signed)
Her blood pressure remains elevated. We will add lisinopril 10 mg a day. Her potassium is 3.9.  Her potassium remains low after and the lisinopril we will add some potassium

## 2014-03-03 NOTE — Progress Notes (Signed)
Daine Gip Date of Birth  [birth year]       [facility] Office [address] [place], [place], [place]  [address]   [place], [place]  [address] [address]   Fax  [phone]     Fax [phone]  Problem List: 1. Dyspnea 2. Leg swelling 3. Hypertension 4. Hyperlipidemia 5. History of left breast cancer-status post irradiation and chemotherapy  History of Present Illness:  Devri is a 68 yo with 2 month history of leg swelling and shortness of breath.  She occasionally has some episodes of chest discomfort/pressure.  She tends have chest pressure with exertion-for example carrying groceries in from the car.   Former smoker, Father had  Some heart problems.   Current Outpatient Prescriptions on File Prior to Visit  Medication Sig Dispense Refill  . aspirin 81 MG tablet Take 160 mg by mouth daily.      . diclofenac (VOLTAREN) 75 MG EC tablet TAKE ONE TABLET BY MOUTH TWICE DAILY  180 tablet  0  . docusate sodium (COLACE) 100 MG capsule Take 100 mg by mouth as needed for constipation.      . metoprolol succinate (TOPROL-XL) 50 MG 24 hr tablet Take 1 tablet (50 mg total) by mouth daily.  90 tablet  3  . milk thistle 175 MG tablet Take 175 mg by mouth daily.      Marland Kitchen nystatin cream (MYCOSTATIN) Apply 1 application topically 2 (two) times daily.  30 g  1  . simvastatin (ZOCOR) 20 MG tablet TAKE ONE TABLET BY MOUTH AT BEDTIME **Needs follow-up appt before future refills are given**  90 tablet  1   No current facility-administered medications on file prior to visit.    No Active Allergies  Past Medical History  Diagnosis Date  . High cholesterol   . Personal history of malignant neoplasm of breast 2010  . Arthritis   . Hypertension   . Cancer 2010     wide local excision and sentinel node biopsy her for left breast cancer in April 03, 2009. This was ER/PR negative, HER-2/neu overexpressing infiltrating  lobular carcinoma with lobular carcinoma in situ. Sentinel nodes were negative    Past Surgical History  Procedure Laterality Date  . Breast lumpectomy Left 2010     wide local excision and sentinel node biopsy her for left breast cancer in April 03, 2009. 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 Sentinel nodes were negative  . Colonoscopy  2011    dR. eLLIOTT  . Tonsillectomy    . Abdominal hysterectomy  1983  . Abdominoplasty  1999  . Power port placement   2010  . Mm breast stereo bx*l*r/s Left 2010    History  Smoking status  . Former Smoker -- 1.50 packs/day for 15 years  . Types: Cigarettes  . Quit date: 01/23/1982  Smokeless tobacco  . Not on file    Comment: quit 1983    History  Alcohol Use  . Yes    Comment: occassionally    Family History  Problem Relation Age of Onset  . Breast cancer Paternal Aunt   . Ovarian cancer    . Colon cancer    . Hypertension Mother   . Hyperlipidemia Mother   . Hypertension Father     Reviw of Systems:  Reviewed in the HPI.  All other systems are  negative.  Physical Exam: Blood pressure 158/90, pulse 85, height _0  (1.448 m), weight 152 lb (68.947 kg). Wt Readings from Last 3 Encounters:  03/03/14 152 lb (68.947 kg)  02/17/14 153 lb (69.4 kg)  11/26/13 149 lb 12 oz (67.926 kg)     General: Well developed, well nourished, in no acute distress.  Head: Normocephalic, atraumatic, sclera non-icteric, mucus membranes are moist,   Neck: Supple. Carotids are 2 + without bruits. No JVD   Lungs: Clear   Heart: Rr, normal S1S2  Abdomen: Soft, non-tender, non-distended with normal bowel sounds.  Msk:  Strength and tone are normal   Extremities: No clubbing or cyanosis. No edema.  Distal pedal pulses are 2+ and equal    Neuro: CN II - XII intact.  Alert and oriented X 3.   Psych:  Normal   ECG: March 03, 2014:  NSR at 85, NS ST abnormality in  the lateral leads.   Assessment / Plan:

## 2014-03-03 NOTE — Patient Instructions (Signed)
Please start lisinopril 10 mg daily  We will check your labs in 3 weeks: BMET   Your physician has requested that you have an echocardiogram. Echocardiography is a painless test that uses sound waves to create images of your heart. It provides your doctor with information about the size and shape of your heart and how well your heart's chambers and valves are working. This procedure takes approximately one hour. There are no restrictions for this procedure.  Your physician recommends that you schedule a follow-up appointment in: 3 months  [place] caregiver has ordered a Stress Test with nuclear imaging. The purpose of this test is to evaluate the blood supply to your heart muscle. This procedure is referred to as a "Non-Invasive Stress Test." This is because other than having an IV started in your vein, nothing is inserted or "invades" your body. Cardiac stress tests are done to find areas of poor blood flow to the heart by determining the extent of coronary artery disease (CAD). Some patients exercise on a treadmill, which naturally increases the blood flow to your heart, while others who are  unable to walk on a treadmill due to physical limitations have a pharmacologic/chemical stress agent called Lexiscan . This medicine will mimic walking on a treadmill by temporarily increasing your coronary blood flow.   Please note: these test may take anywhere between 2-4 hours to complete  PLEASE REPORT TO [facility] AT THE FIRST DESK WILL DIRECT YOU WHERE TO GO  Date of Procedure:_________Wednesday, March 25_____________  Arrival Time for Procedure:_________7:45am____________________  Instructions regarding medication:   __X__:  Hold betablocker(s) night before procedure and morning of procedure: METOPROLOL   PLEASE NOTIFY THE OFFICE AT LEAST 24 HOURS IN ADVANCE IF YOU ARE UNABLE TO KEEP YOUR APPOINTMENT.  [phone] AND  PLEASE NOTIFY NUCLEAR  MEDICINE AT [facility] AT LEAST 24 HOURS IN ADVANCE IF YOU ARE UNABLE TO KEEP YOUR APPOINTMENT. [phone]  How to prepare for your Myoview test:  1. Do not eat or drink after midnight 2. No caffeine for 24 hours prior to test 3. No smoking 24 hours prior to test. 4. Your medication may be taken with water.  If your doctor stopped a medication because of this test, do not take that medication. 5. Ladies, please do not wear dresses.  Skirts or pants are appropriate. Please wear a short sleeve shirt. 6. No perfume, cologne or lotion. 7. Wear comfortable walking shoes. No heels!

## 2014-03-05 ENCOUNTER — Ambulatory Visit: Payer: Self-pay

## 2014-03-05 ENCOUNTER — Telehealth: Payer: Self-pay | Admitting: *Deleted

## 2014-03-05 DIAGNOSIS — R079 Chest pain, unspecified: Secondary | ICD-10-CM

## 2014-03-05 NOTE — Telephone Encounter (Signed)
Patient call wanting stress results

## 2014-03-05 NOTE — Telephone Encounter (Signed)
Spoke w/ pt and advised her that her stress test from this am had not been interpreted yet and she would receive a call w/ her results.

## 2014-03-06 ENCOUNTER — Other Ambulatory Visit: Payer: Self-pay

## 2014-03-06 DIAGNOSIS — R0789 Other chest pain: Secondary | ICD-10-CM

## 2014-03-06 DIAGNOSIS — I10 Essential (primary) hypertension: Secondary | ICD-10-CM

## 2014-03-06 DIAGNOSIS — R0609 Other forms of dyspnea: Secondary | ICD-10-CM

## 2014-03-25 ENCOUNTER — Other Ambulatory Visit: Payer: Medicare Other

## 2014-03-26 ENCOUNTER — Other Ambulatory Visit: Payer: Self-pay | Admitting: *Deleted

## 2014-03-26 MED ORDER — HYDROCHLOROTHIAZIDE 25 MG PO TABS: 25.0000 mg | ORAL_TABLET | Freq: Every day | ORAL | Status: DC

## 2014-03-26 MED ORDER — SIMVASTATIN 20 MG PO TABS: ORAL_TABLET | ORAL | Status: DC

## 2014-03-26 NOTE — Addendum Note (Signed)
Addended by: Daralene Milch C on: 03/26/2014 12:07 PM   Modules accepted: Orders

## 2014-03-28 ENCOUNTER — Other Ambulatory Visit (INDEPENDENT_AMBULATORY_CARE_PROVIDER_SITE_OTHER): Payer: Medicare Other

## 2014-03-28 ENCOUNTER — Other Ambulatory Visit: Payer: Self-pay

## 2014-03-28 ENCOUNTER — Ambulatory Visit (INDEPENDENT_AMBULATORY_CARE_PROVIDER_SITE_OTHER): Payer: Medicare Other | Admitting: *Deleted

## 2014-03-28 DIAGNOSIS — R0609 Other forms of dyspnea: Secondary | ICD-10-CM

## 2014-03-28 DIAGNOSIS — I10 Essential (primary) hypertension: Secondary | ICD-10-CM

## 2014-03-28 DIAGNOSIS — R0789 Other chest pain: Secondary | ICD-10-CM

## 2014-03-28 DIAGNOSIS — R609 Edema, unspecified: Secondary | ICD-10-CM

## 2014-03-28 DIAGNOSIS — R0602 Shortness of breath: Secondary | ICD-10-CM

## 2014-03-28 DIAGNOSIS — R079 Chest pain, unspecified: Secondary | ICD-10-CM

## 2014-03-29 LAB — BASIC METABOLIC PANEL
BUN / CREAT RATIO: 25 (ref 11–26)
BUN: 20 mg/dL (ref 8–27)
CHLORIDE: 98 mmol/L (ref 97–108)
CO2: 28 mmol/L (ref 18–29)
Calcium: 10.1 mg/dL (ref 8.7–10.3)
Creatinine, Ser: 0.81 mg/dL (ref 0.57–1.00)
GFR calc non Af Amer: 75 mL/min/{1.73_m2} (ref >59)
GFR, EST AFRICAN AMERICAN: 86 mL/min/{1.73_m2} (ref >59)
Glucose: 95 mg/dL (ref 65–99)
POTASSIUM: 3.9 mmol/L (ref 3.5–5.2)
SODIUM: 143 mmol/L (ref 134–144)

## 2014-04-01 ENCOUNTER — Ambulatory Visit: Payer: Self-pay | Admitting: General Surgery

## 2014-04-15 ENCOUNTER — Encounter: Payer: Self-pay | Admitting: Internal Medicine

## 2014-04-15 ENCOUNTER — Ambulatory Visit (INDEPENDENT_AMBULATORY_CARE_PROVIDER_SITE_OTHER): Payer: Medicare Other | Admitting: Internal Medicine

## 2014-04-15 VITALS — BP 126/76 | HR 75 | Temp 98.1°F | Wt 146.5 lb

## 2014-04-15 DIAGNOSIS — I10 Essential (primary) hypertension: Secondary | ICD-10-CM

## 2014-04-15 MED ORDER — LOSARTAN POTASSIUM 25 MG PO TABS: 25.0000 mg | ORAL_TABLET | Freq: Every day | ORAL | Status: DC

## 2014-04-15 NOTE — Patient Instructions (Addendum)

## 2014-04-15 NOTE — Progress Notes (Signed)
Pre visit review using our clinic review tool, if applicable. No additional management support is needed unless otherwise documented below in the visit note. 

## 2014-04-15 NOTE — Assessment & Plan Note (Signed)
Well controlled on lisinopril, HCTZ and metoprolol She believes her cough is related to the lisinopril and wants the medication d/c'd Will try losartan instead Advised her to follow up with cardiology at her next scheduled appt- should be in June

## 2014-04-15 NOTE — Progress Notes (Signed)
Subjective:    Patient ID: Kathleen Jones, female    DOB: 02-21-1946, 68 y.o.   MRN: [id number]  HPI  Pt presents to the clinic today with c/o cough. She reports this started 2 days after starting on Lisinopril. She describes the cough as dry, hacking and nonproductive. She feels like it is worse at night. She has not stopped her lisinopril but she wants to discontinue it. She is being followed by Dr. Cathie Olden, cardiology and was advised to follow up at the end of June. She denies chest pain, chest tightness or shortness of breath.  Review of Systems      Past Medical History  Diagnosis Date  . High cholesterol   . Personal history of malignant neoplasm of breast 2010  . Arthritis   . Hypertension   . Cancer 2010     wide local excision and sentinel node biopsy her for left breast cancer in April 03, 2009. 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 Sentinel nodes were negative    Current Outpatient Prescriptions  Medication Sig Dispense Refill  . aspirin 81 MG tablet Take 160 mg by mouth daily.      . diclofenac (VOLTAREN) 75 MG EC tablet TAKE ONE TABLET BY MOUTH TWICE DAILY  180 tablet  0  . docusate sodium (COLACE) 100 MG capsule Take 100 mg by mouth as needed for constipation.      . hydrochlorothiazide (HYDRODIURIL) 25 MG tablet Take 1 tablet (25 mg total) by mouth daily.  90 tablet  1  . lisinopril (PRINIVIL,ZESTRIL) 10 MG tablet Take 1 tablet (10 mg total) by mouth daily.  90 tablet  3  . metoprolol succinate (TOPROL-XL) 50 MG 24 hr tablet Take 1 tablet (50 mg total) by mouth daily.  90 tablet  3  . milk thistle 175 MG tablet Take 175 mg by mouth daily.      Marland Kitchen nystatin cream (MYCOSTATIN) Apply 1 application topically 2 (two) times daily.  30 g  1  . simvastatin (ZOCOR) 20 MG tablet TAKE ONE TABLET BY MOUTH AT BEDTIME  90 tablet  1   No current facility-administered medications for this visit.    No Known  Allergies  Family History  Problem Relation Age of Onset  . Breast cancer Paternal Aunt   . Ovarian cancer    . Colon cancer    . Hypertension Mother   . Hyperlipidemia Mother   . Hypertension Father     History   Social History  . Marital Status: Married    Spouse Name: N/A    Number of Children: N/A  . Years of Education: N/A   Occupational History  . Not on file.   Social History Main Topics  . Smoking status: Former Smoker -- 1.50 packs/day for 15 years    Types: Cigarettes    Quit date: 01/23/1982  . Smokeless tobacco: Not on file     Comment: quit 1983  . Alcohol Use: Yes     Comment: occassionally  . Drug Use: No  . Sexual Activity: Not on file   Other Topics Concern  . Not on file   Social History Narrative   Has a Living Will.   Desires CPR.  Would not desire prolonged life support.              Constitutional: Denies fever, malaise, fatigue, headache or abrupt weight changes.  HEENT: Denies eye pain, eye redness, ear pain, ringing  in the ears, wax buildup, runny nose, nasal congestion, bloody nose, or sore throat. Respiratory: Pt reports cough. Denies difficulty breathing, shortness of breath,  or sputum production.   Cardiovascular: Denies chest pain, chest tightness, palpitations or swelling in the hands or feet.    No other specific complaints in a complete review of systems (except as listed in HPI above).  Objective:   Physical Exam  BP 126/76  Pulse 75  Temp(Src) 98.1 F (36.7 C) (Oral)  Wt 146 lb 8 oz (66.452 kg)  SpO2 98% Wt Readings from Last 3 Encounters:  04/15/14 146 lb 8 oz (66.452 kg)  03/03/14 152 lb (68.947 kg)  02/17/14 153 lb (69.4 kg)    General: Appears her stated age, well developed, well nourished in NAD. HEENT: Head: normal shape and size; Eyes: sclera white, no icterus, conjunctiva pink, PERRLA and EOMs intact; Ears: Tm's gray and intact, normal light reflex; Nose: mucosa pink and moist, septum midline;  Throat/Mouth: Teeth present, mucosa pink and moist, no exudate, lesions or ulcerations noted. .  Cardiovascular: Normal rate and rhythm. S1,S2 noted.  No murmur, rubs or gallops noted. No JVD or BLE edema. No carotid bruits noted. Pulmonary/Chest: Normal effort and positive vesicular breath sounds. No respiratory distress. No wheezes, rales or ronchi noted.    BMET    Component Value Date/Time   NA 143 03/28/2014 1430   NA 139 02/10/2014 0807   K 3.9 03/28/2014 1430   CL 98 03/28/2014 1430   CO2 28 03/28/2014 1430   GLUCOSE 95 03/28/2014 1430   GLUCOSE 100* 02/10/2014 0807   BUN 20 03/28/2014 1430   BUN 21 02/10/2014 0807   CREATININE 0.81 03/28/2014 1430   CALCIUM 10.1 03/28/2014 1430   GFRNONAA 75 03/28/2014 1430   GFRAA 86 03/28/2014 1430    Lipid Panel     Component Value Date/Time   CHOL 193 02/10/2014 0807   TRIG 222.0* 02/10/2014 0807   HDL 44.50 02/10/2014 0807   CHOLHDL 4 02/10/2014 0807   VLDL 44.4* 02/10/2014 0807   LDLCALC 104* 02/10/2014 0807    CBC    Component Value Date/Time   WBC 6.4 02/10/2014 0807   RBC 4.39 02/10/2014 0807   HGB 12.9 02/10/2014 0807   HCT 39.5 02/10/2014 0807   PLT 219.0 02/10/2014 0807   MCV 90.0 02/10/2014 0807   MCHC 32.6 02/10/2014 0807   RDW 14.2 02/10/2014 0807   LYMPHSABS 2.0 02/10/2014 0807   MONOABS 0.5 02/10/2014 0807   EOSABS 0.3 02/10/2014 0807   BASOSABS 0.0 02/10/2014 0807    Hgb A1C No results found for this basename: HGBA1C         Assessment & Plan:

## 2014-04-17 ENCOUNTER — Ambulatory Visit (INDEPENDENT_AMBULATORY_CARE_PROVIDER_SITE_OTHER): Payer: Medicare Other | Admitting: General Surgery

## 2014-04-17 ENCOUNTER — Encounter: Payer: Self-pay | Admitting: General Surgery

## 2014-04-17 VITALS — BP 150/80 | HR 64 | Resp 12 | Ht <= 58 in | Wt 148.0 lb

## 2014-04-17 DIAGNOSIS — Z853 Personal history of malignant neoplasm of breast: Secondary | ICD-10-CM

## 2014-04-17 NOTE — Progress Notes (Signed)
Patient ID: Kathleen Jones, female   DOB: 01/31/1946, 67 y.o.   MRN: [id number]  Chief Complaint  Patient presents with  . Follow-up    mammogram    HPI Kathleen Jones is a 67 y.o. female. who presents for a breast evaluation. The most recent mammogram was done on 04/01/14. Patient does perform regular self breast checks and gets regular mammograms done.    HPI  Past Medical History  Diagnosis Date  . High cholesterol   . Personal history of malignant neoplasm of breast 2010  . Arthritis   . Hypertension   . Cancer 2010     T1a, N0; ER neg/ PR neg, Her 2 neu 3+ wide local excision and sentinel node biopsy her for left breast cancer in April 03, 2009. 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 Sentinel nodes were negative. Adjuvant chemo and RT.    Past Surgical History  Procedure Laterality Date  . Breast lumpectomy Left 2010     wide local excision and sentinel node biopsy her for left breast cancer in April 03, 2009. 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 Sentinel nodes were negative  . Colonoscopy  2011    dR. eLLIOTT  . Tonsillectomy    . Abdominal hysterectomy  1983  . Abdominoplasty  1999  . Power port placement   2010  . Mm breast stereo bx*l*r/s Left 2010    Family History  Problem Relation Age of Onset  . Breast cancer Paternal Aunt   . Ovarian cancer    . Colon cancer    . Hypertension Mother   . Hyperlipidemia Mother   . Hypertension Father     Social History History  Substance Use Topics  . Smoking status: Former Smoker -- 1.50 packs/day for 15 years    Types: Cigarettes    Quit date: 01/23/1982  . Smokeless tobacco: Not on file     Comment: quit 1983  . Alcohol Use: Yes     Comment: occassionally    No Known Allergies  Current Outpatient Prescriptions  Medication Sig Dispense Refill  . aspirin 81 MG tablet  Take 160 mg by mouth daily.      . diclofenac (VOLTAREN) 75 MG EC tablet TAKE ONE TABLET BY MOUTH TWICE DAILY  180 tablet  0  . docusate sodium (COLACE) 100 MG capsule Take 100 mg by mouth as needed for constipation.      . hydrochlorothiazide (HYDRODIURIL) 25 MG tablet Take 1 tablet (25 mg total) by mouth daily.  90 tablet  1  . losartan (COZAAR) 25 MG tablet Take 1 tablet (25 mg total) by mouth daily.  30 tablet  2  . metoprolol succinate (TOPROL-XL) 50 MG 24 hr tablet Take 1 tablet (50 mg total) by mouth daily.  90 tablet  3  . milk thistle 175 MG tablet Take 175 mg by mouth daily.      . nystatin cream (MYCOSTATIN) Apply 1 application topically 2 (two) times daily.  30 g  1  . simvastatin (ZOCOR) 20 MG tablet TAKE ONE TABLET BY MOUTH AT BEDTIME  90 tablet  1   No current facility-administered medications for this visit.    Review of Systems Review of Systems  Constitutional: Negative.   Respiratory: Negative.   Cardiovascular: Negative.     Blood pressure 150/80, pulse 64, resp. rate 12, height 4' 9" (1.448 m), weight 148 lb (67.132 kg).    Physical Exam Physical Exam  Constitutional: She is oriented to person, place, and time. She appears well-developed and well-nourished.  Eyes: Conjunctivae are normal.  Neck: Neck supple.  Cardiovascular: Normal rate, regular rhythm and normal heart sounds.   Pulmonary/Chest: Effort normal and breath sounds normal. Right breast exhibits no inverted nipple, no mass, no nipple discharge, no skin change and no tenderness. Left breast exhibits no inverted nipple, no mass, no nipple discharge, no skin change and no tenderness.    Right chest wall well healed port site. Left breast small area of pigment lost under scar.    Lymphadenopathy:    She has no cervical adenopathy.    She has no axillary adenopathy.  Neurological: She is alert and oriented to person, place, and time.  Skin: Skin is warm and dry.    Data Reviewed Bilateral mammograms  dated 04/01/2014 showed stable postoperative changes. BI-RAD-2.  The patient reported that she waited 30 minutes to have the radiologist however the exam was normal. She was somewhat frustrated by this change in practice.  Assessment    Doing well now 5 years status post management of AER/PR negative HER-2/neu overexpressing invasive lobular carcinoma the left breast.    Plan    The patient reports she's not been seen by medical oncology in several years. We discussed options for ongoing mammographic management. She was amenable to return in one year for repeat exam in this office. Due to her recently experienced at [facility] she was desirous of having her films completed at [facility].    PCP: Aron, Talia     Jeffrey W Byrnett 04/20/2014, 10:09 AM    

## 2014-04-17 NOTE — Patient Instructions (Signed)
The patient has been asked to return to the office in one year for a bilateral screening mammogram. 

## 2014-04-20 ENCOUNTER — Encounter: Payer: Self-pay | Admitting: General Surgery

## 2014-04-21 ENCOUNTER — Other Ambulatory Visit: Payer: Self-pay | Admitting: Internal Medicine

## 2014-04-21 ENCOUNTER — Telehealth: Payer: Self-pay

## 2014-04-21 MED ORDER — HYDROCODONE-HOMATROPINE 5-1.5 MG/5ML PO SYRP: 5.0000 mL | ORAL_SOLUTION | Freq: Three times a day (TID) | ORAL | Status: DC | PRN

## 2014-04-21 NOTE — Telephone Encounter (Signed)
Pt left v/m; pt was seen on 04/15/14 and stopped lisinopril; pt said non prod cough is worse; pt coughs continuously 24 hours per day; cannot sleep due to cough;pt coughing so hard actually vomits; pt said her back hurts from coughing so much. Pt developed S/T on 04/19/14; throat is scratchy but very sore. No SOB, fever or wheezing. Pt request med for cough; [place] request cb.

## 2014-04-21 NOTE — Telephone Encounter (Signed)
Will get RX for hycodan, she will need to come pick  Up. She needs to make a follow up appt with her PCP

## 2014-04-21 NOTE — Telephone Encounter (Signed)
Pt is aware as instructed and Rx in front office for pick up

## 2014-04-25 ENCOUNTER — Encounter: Payer: Self-pay | Admitting: General Surgery

## 2014-05-13 ENCOUNTER — Other Ambulatory Visit: Payer: Self-pay

## 2014-05-13 DIAGNOSIS — I10 Essential (primary) hypertension: Secondary | ICD-10-CM

## 2014-05-13 MED ORDER — LOSARTAN POTASSIUM 25 MG PO TABS: 25.0000 mg | ORAL_TABLET | Freq: Every day | ORAL | Status: DC

## 2014-05-13 NOTE — Telephone Encounter (Signed)
Pt requested 90 day supply--however she had 2 refills left on original Rx-- I canceled 2 refills from previous Rx and okayed the 90 day supply with no addit. refills

## 2014-07-18 ENCOUNTER — Other Ambulatory Visit: Payer: Self-pay | Admitting: *Deleted

## 2014-07-18 DIAGNOSIS — I10 Essential (primary) hypertension: Secondary | ICD-10-CM

## 2014-07-18 MED ORDER — LOSARTAN POTASSIUM 25 MG PO TABS: 25.0000 mg | ORAL_TABLET | Freq: Every day | ORAL | Status: DC

## 2014-07-24 MED ORDER — LOSARTAN POTASSIUM 25 MG PO TABS: 25.0000 mg | ORAL_TABLET | Freq: Every day | ORAL | Status: DC

## 2014-07-24 NOTE — Telephone Encounter (Addendum)
Pt called to ck on status of losartan refill to [facility] [address]. Spoke with pharmacist did find where med was called in. Medication phoned to [address] pharmacy as instructed. Pt notified and apologized to pt.pt has already been contacted by cardiology for appt.

## 2014-07-24 NOTE — Addendum Note (Signed)
Addended by: Helene Shoe on: 07/24/2014 11:04 AM   Modules accepted: Orders

## 2014-07-25 ENCOUNTER — Other Ambulatory Visit: Payer: Self-pay | Admitting: *Deleted

## 2014-07-25 DIAGNOSIS — I10 Essential (primary) hypertension: Secondary | ICD-10-CM

## 2014-07-25 NOTE — Telephone Encounter (Signed)
Electronic refill request. Last refill states patient is overdue for Cardiology appt.  Please advise.

## 2014-07-25 NOTE — Telephone Encounter (Signed)
Cardiology would still refill this- she was just seen in 02/2014.

## 2014-08-06 ENCOUNTER — Encounter: Payer: Self-pay | Admitting: Internal Medicine

## 2014-08-06 ENCOUNTER — Ambulatory Visit (INDEPENDENT_AMBULATORY_CARE_PROVIDER_SITE_OTHER): Payer: Medicare Other | Admitting: Internal Medicine

## 2014-08-06 ENCOUNTER — Telehealth: Payer: Self-pay | Admitting: *Deleted

## 2014-08-06 ENCOUNTER — Ambulatory Visit (INDEPENDENT_AMBULATORY_CARE_PROVIDER_SITE_OTHER): Payer: Medicare Other | Admitting: Cardiovascular Disease

## 2014-08-06 ENCOUNTER — Encounter: Payer: Self-pay | Admitting: Cardiovascular Disease

## 2014-08-06 VITALS — BP 196/98 | HR 71 | Temp 97.8°F | Wt 149.0 lb

## 2014-08-06 VITALS — BP 126/79 | HR 70 | Ht <= 58 in | Wt 149.2 lb

## 2014-08-06 DIAGNOSIS — I10 Essential (primary) hypertension: Secondary | ICD-10-CM

## 2014-08-06 DIAGNOSIS — R030 Elevated blood-pressure reading, without diagnosis of hypertension: Secondary | ICD-10-CM

## 2014-08-06 MED ORDER — CLONIDINE HCL 0.1 MG PO TABS
0.1000 mg | ORAL_TABLET | Freq: Once | ORAL | Status: AC
  Administered 2014-08-06: 0.1 mg via ORAL

## 2014-08-06 MED ORDER — LOSARTAN POTASSIUM 50 MG PO TABS: 50.0000 mg | ORAL_TABLET | Freq: Every day | ORAL | Status: DC

## 2014-08-06 NOTE — Patient Instructions (Signed)
Your physician has recommended you make the following change in your medication:  Increase Losartan to 50 mg once daily   Your physician recommends that you schedule a follow-up appointment in:  3 months    Your physician recommends that you return for lab work in:  At your 3 month visit

## 2014-08-06 NOTE — Patient Instructions (Addendum)

## 2014-08-06 NOTE — Progress Notes (Signed)
Pre visit review using our clinic review tool, if applicable. No additional management support is needed unless otherwise documented below in the visit note. 

## 2014-08-06 NOTE — Progress Notes (Signed)
Kathleen Jones Date of Birth  [birth year]       [facility] Office [address], [address], [place], [place]  [address]   [place], [place]  [address] [place]   Fax  [phone]     Fax [phone]  Problem List: 1. Dyspnea 2. Leg swelling 3. Hypertension 4. Hyperlipidemia 5. History of left breast cancer-status post irradiation and chemotherapy  History of Present Illness:  Kathleen Jones is a 68 yo with 2 month history of leg swelling and shortness of breath.  She occasionally has some episodes of chest discomfort/pressure.  She tends have chest pressure with exertion-for example carrying groceries in from the car.   Former smoker, Father had  Some heart problems.   August 06, 2014:  This is seen today as a work in visit.   I saw her earlier this year. We started her on lisinopril but this caused an ACE inhibitor cough. She was changed to losartan and the cough has greatly improved although it is not completely gone.   She had a normal morning.  She did not eat because she had a dental cleaning.  Her blood pressure was noted to be elevated at the dose. She was at her medical doctor's office and received some extra medication- clonidine.    Current Outpatient Prescriptions on File Prior to Visit  Medication Sig Dispense Refill  . ALPHA LIPOIC ACID PO Take 1 capsule by mouth daily.      Marland Kitchen aspirin 81 MG tablet Take 81 mg by mouth daily.       Marland Kitchen CINNAMON PO Take 1 capsule by mouth daily.      . diclofenac (VOLTAREN) 75 MG EC tablet TAKE ONE TABLET BY MOUTH TWICE DAILY  180 tablet  0  . docusate sodium (COLACE) 100 MG capsule Take 100 mg by mouth as needed for constipation.      . hydrochlorothiazide (HYDRODIURIL) 25 MG tablet Take 1 tablet (25 mg total) by mouth daily.  90 tablet  1  . losartan (COZAAR) 25 MG tablet Take 1 tablet (25 mg total) by mouth daily. *Overdue for appointment with cardiologist, please  schedule*  90 tablet  0  . metoprolol succinate (TOPROL-XL) 50 MG 24 hr tablet Take 1 tablet (50 mg total) by mouth daily.  90 tablet  3  . milk thistle 175 MG tablet Take 175 mg by mouth daily.      Marland Kitchen nystatin cream (MYCOSTATIN) Apply 1 application topically 2 (two) times daily.  30 g  1  . simvastatin (ZOCOR) 20 MG tablet TAKE ONE TABLET BY MOUTH AT BEDTIME  90 tablet  1   No current facility-administered medications on file prior to visit.    No Known Allergies  Past Medical History  Diagnosis Date  . High cholesterol   . Personal history of malignant neoplasm of breast 2010  . Arthritis   . Hypertension   . Cancer 2010     T1a, N0; ER neg/ PR neg, Her 2 neu 3+ wide local excision and sentinel node biopsy her for left breast cancer in April 03, 2009. 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 Sentinel nodes were negative. Adjuvant chemo and RT.    Past Surgical History  Procedure Laterality Date  . Breast lumpectomy Left 2010     wide local excision and sentinel node biopsy her  for left breast cancer in April 03, 2009. 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 Sentinel nodes were negative  . Colonoscopy  2011    dR. eLLIOTT  . Tonsillectomy    . Abdominal hysterectomy  1983  . Abdominoplasty  1999  . Power port placement   2010  . Mm breast stereo bx*l*r/s Left 2010    History  Smoking status  . Former Smoker -- 1.50 packs/day for 15 years  . Types: Cigarettes  . Quit date: 01/23/1982  Smokeless tobacco  . Not on file    Comment: quit 1983    History  Alcohol Use  . Yes    Comment: occassionally    Family History  Problem Relation Age of Onset  . Breast cancer Paternal Aunt   . Ovarian cancer    . Colon cancer    . Hypertension Mother   . Hyperlipidemia Mother   . Hypertension Father     Reviw of Systems:  Reviewed  in the HPI.  All other systems are negative.  Physical Exam: Blood pressure 126/79, pulse 70, height _0  (1.448 m), weight 149 lb 4 oz (67.699 kg). Wt Readings from Last 3 Encounters:  08/06/14 149 lb 4 oz (67.699 kg)  08/06/14 149 lb (67.586 kg)  04/17/14 148 lb (67.132 kg)     General: Well developed, well nourished, in no acute distress.  Head: Normocephalic, atraumatic, sclera non-icteric, mucus membranes are moist,   Neck: Supple. Carotids are 2 + without bruits. No JVD   Lungs: Clear   Heart: Rr, normal S1S2  Abdomen: Soft, non-tender, non-distended with normal bowel sounds.  Msk:  Strength and tone are normal   Extremities: No clubbing or cyanosis. No edema.  Distal pedal pulses are 2+ and equal    Neuro: CN II - XII intact.  Alert and oriented X 3.   Psych:  Normal   ECG: March 03, 2014:  NSR at 85, NS ST abnormality in the lateral leads.   Assessment / Plan:

## 2014-08-06 NOTE — Telephone Encounter (Signed)
Patients NP from [place] called and stated patient is in the office with elevated BP  She has given the patient clonidine x 2 in the office and her blood pressure is still 196/98 She stated patient is not symptomatic  She requested that patient be seen by Dr. Acie Fredrickson today  Patient scheduled for 11:45 Dr. Acie Fredrickson is aware

## 2014-08-06 NOTE — Assessment & Plan Note (Signed)
She doesn't have significant hypertension at her medical doctor's office today. She received some extra medication-clonidine and her blood pressures normal here today. She does not take her blood pressure on a regular basis but encouraged her to get a blood pressure cuff and to record it on a regular basis. We will increase the losartan 50 mg a day.  I will see her in 3 months for office visit and BMP.

## 2014-08-06 NOTE — Progress Notes (Signed)
Subjective:    Patient ID: Kathleen Jones, female    DOB: 07/04/1946, 68 y.o.   MRN: [id number]  HPI  Pt presents to the clinic today with c/o elevated blood pressure. She was at her dentist office this morning. They advised her that her BP was 203/103. She does report some discomfort in between her shoulder blades. She denies chest pain, chest tightness or shortness of breath. She does not monitor her blood pressure at home.  04/2014: Lisinopril-HCT was d/c'd secondary to a cough. She was switched to Losartan-HCT. She continued the metoprolol at that time. Was advised to follow up in 1 month with me or cardiology. She did not follow up.  Review of Systems      Past Medical History  Diagnosis Date  . High cholesterol   . Personal history of malignant neoplasm of breast 2010  . Arthritis   . Hypertension   . Cancer 2010     T1a, N0; ER neg/ PR neg, Her 2 neu 3+ wide local excision and sentinel node biopsy her for left breast cancer in April 03, 2009. 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 Sentinel nodes were negative. Adjuvant chemo and RT.    Current Outpatient Prescriptions  Medication Sig Dispense Refill  . aspirin 81 MG tablet Take 160 mg by mouth daily.      . diclofenac (VOLTAREN) 75 MG EC tablet TAKE ONE TABLET BY MOUTH TWICE DAILY  180 tablet  0  . docusate sodium (COLACE) 100 MG capsule Take 100 mg by mouth as needed for constipation.      . hydrochlorothiazide (HYDRODIURIL) 25 MG tablet Take 1 tablet (25 mg total) by mouth daily.  90 tablet  1  . losartan (COZAAR) 25 MG tablet Take 1 tablet (25 mg total) by mouth daily. *Overdue for appointment with cardiologist, please schedule*  90 tablet  0  . metoprolol succinate (TOPROL-XL) 50 MG 24 hr tablet Take 1 tablet (50 mg total) by mouth daily.  90 tablet  3  . milk thistle 175 MG tablet Take 175 mg by mouth daily.      Marland Kitchen nystatin cream (MYCOSTATIN) Apply  1 application topically 2 (two) times daily.  30 g  1  . simvastatin (ZOCOR) 20 MG tablet TAKE ONE TABLET BY MOUTH AT BEDTIME  90 tablet  1   No current facility-administered medications for this visit.    No Known Allergies  Family History  Problem Relation Age of Onset  . Breast cancer Paternal Aunt   . Ovarian cancer    . Colon cancer    . Hypertension Mother   . Hyperlipidemia Mother   . Hypertension Father     History   Social History  . Marital Status: Married    Spouse Name: N/A    Number of Children: N/A  . Years of Education: N/A   Occupational History  . Not on file.   Social History Main Topics  . Smoking status: Former Smoker -- 1.50 packs/day for 15 years    Types: Cigarettes    Quit date: 01/23/1982  . Smokeless tobacco: Not on file     Comment: quit 1983  . Alcohol Use: Yes     Comment: occassionally  . Drug Use: No  . Sexual Activity: Not on file   Other Topics Concern  . Not on file   Social History Narrative   Has a Living Will.   Desires CPR.  Would not desire prolonged life support.              Constitutional: Denies fever, malaise, fatigue, headache or abrupt weight changes.  HEENT: Denies eye pain, eye redness, ear pain, ringing in the ears, wax buildup, runny nose, nasal congestion, bloody nose, or sore throat. Respiratory: Denies difficulty breathing, shortness of breath, cough or sputum production.   Cardiovascular: Denies chest pain, chest tightness, palpitations or swelling in the hands or feet.   Neurological: Denies dizziness, difficulty with memory, difficulty with speech or problems with balance and coordination.  MSK: Pt reports discomfort between shoulder blades.  No other specific complaints in a complete review of systems (except as listed in HPI above).  Objective:   Physical Exam   BP 204/100  Pulse 71  Temp(Src) 97.8 F (36.6 C) (Oral)  Wt 149 lb (67.586 kg)  SpO2 97% Wt Readings from Last 3 Encounters:    08/06/14 149 lb (67.586 kg)  04/17/14 148 lb (67.132 kg)  04/15/14 146 lb 8 oz (66.452 kg)    General: Appears her stated age, well developed, well nourished in NAD. Cardiovascular: Normal rate and rhythm. S1,S2 noted.  No murmur, rubs or gallops noted. No JVD or BLE edema. No carotid bruits noted. Pulmonary/Chest: Normal effort and positive vesicular breath sounds. No respiratory distress. No wheezes, rales or ronchi noted.  Musculoskeletal: Normal flexion, extension and rotation of the spine. No pain with palpation of the spine. Paraspinal muscles nontender.  Neurological: Alert and oriented.   BMET    Component Value Date/Time   NA 143 03/28/2014 1430   NA 139 02/10/2014 0807   K 3.9 03/28/2014 1430   CL 98 03/28/2014 1430   CO2 28 03/28/2014 1430   GLUCOSE 95 03/28/2014 1430   GLUCOSE 100* 02/10/2014 0807   BUN 20 03/28/2014 1430   BUN 21 02/10/2014 0807   CREATININE 0.81 03/28/2014 1430   CALCIUM 10.1 03/28/2014 1430   GFRNONAA 75 03/28/2014 1430   GFRAA 86 03/28/2014 1430    Lipid Panel     Component Value Date/Time   CHOL 193 02/10/2014 0807   TRIG 222.0* 02/10/2014 0807   HDL 44.50 02/10/2014 0807   CHOLHDL 4 02/10/2014 0807   VLDL 44.4* 02/10/2014 0807   LDLCALC 104* 02/10/2014 0807    CBC    Component Value Date/Time   WBC 6.4 02/10/2014 0807   RBC 4.39 02/10/2014 0807   HGB 12.9 02/10/2014 0807   HCT 39.5 02/10/2014 0807   PLT 219.0 02/10/2014 0807   MCV 90.0 02/10/2014 0807   MCHC 32.6 02/10/2014 0807   RDW 14.2 02/10/2014 0807   LYMPHSABS 2.0 02/10/2014 0807   MONOABS 0.5 02/10/2014 0807   EOSABS 0.3 02/10/2014 0807   BASOSABS 0.0 02/10/2014 0807    Hgb A1C No results found for this basename: HGBA1C        Assessment & Plan:   Malignant HTN:  ECG today: normal and unchanged from prior in 02/2014 Gave 0.1 mg Clonidine in the office Repeat blood pressure 200/98 Repeat Clonidine 0.1 mg given Repeat blood pressure 196/98 Called [facility] and discussed with Dr. Julious Payer nurse- she advised pt  to come to clinic at 11:45 to discuss with Dr. Cathie Olden. No need for ER as patient is stable. Will need blood pressure meds adjusted

## 2014-08-15 ENCOUNTER — Other Ambulatory Visit: Payer: Self-pay

## 2014-08-15 MED ORDER — DICLOFENAC SODIUM 75 MG PO TBEC: DELAYED_RELEASE_TABLET | ORAL | Status: DC

## 2014-08-15 NOTE — Telephone Encounter (Signed)
pt thinks diclofenac refill has been requested from [place] several times in last week. Spoke with Loma Messing at [place] and refill has been going to Dr Deborra Medina at [facility] whatever the reason not coming thru.Do not see refill request in pts chart for diclofenac.  Pt is at [address] and request refill to be done now. Pt request cb on [phone].

## 2014-08-15 NOTE — Telephone Encounter (Signed)
Pt notified refill done for diclofenac to [facility] [address].

## 2014-09-11 ENCOUNTER — Other Ambulatory Visit: Payer: Self-pay | Admitting: *Deleted

## 2014-09-11 MED ORDER — HYDROCHLOROTHIAZIDE 25 MG PO TABS: 25.0000 mg | ORAL_TABLET | Freq: Every day | ORAL | Status: DC

## 2014-09-25 ENCOUNTER — Other Ambulatory Visit: Payer: Self-pay | Admitting: *Deleted

## 2014-09-25 MED ORDER — SIMVASTATIN 20 MG PO TABS: ORAL_TABLET | ORAL | Status: DC

## 2014-10-13 ENCOUNTER — Encounter: Payer: Self-pay | Admitting: Cardiovascular Disease

## 2014-10-21 ENCOUNTER — Other Ambulatory Visit: Payer: Self-pay | Admitting: *Deleted

## 2014-10-24 ENCOUNTER — Other Ambulatory Visit: Payer: Self-pay | Admitting: *Deleted

## 2014-10-24 MED ORDER — SIMVASTATIN 20 MG PO TABS: ORAL_TABLET | ORAL | Status: DC

## 2014-10-28 ENCOUNTER — Other Ambulatory Visit (INDEPENDENT_AMBULATORY_CARE_PROVIDER_SITE_OTHER): Payer: Medicare Other

## 2014-10-28 ENCOUNTER — Other Ambulatory Visit: Payer: Self-pay | Admitting: Family Medicine

## 2014-10-28 DIAGNOSIS — E785 Hyperlipidemia, unspecified: Secondary | ICD-10-CM

## 2014-10-28 DIAGNOSIS — I10 Essential (primary) hypertension: Secondary | ICD-10-CM

## 2014-10-28 LAB — COMPREHENSIVE METABOLIC PANEL
ALBUMIN: 4.4 g/dL (ref 3.5–5.2)
ALT: 41 U/L — AB (ref 0–35)
AST: 33 U/L (ref 0–37)
Alkaline Phosphatase: 73 U/L (ref 39–117)
BUN: 19 mg/dL (ref 6–23)
CALCIUM: 10 mg/dL (ref 8.4–10.5)
CHLORIDE: 101 meq/L (ref 96–112)
CO2: 31 mEq/L (ref 19–32)
Creatinine, Ser: 0.9 mg/dL (ref 0.4–1.2)
GFR: 65.08 mL/min (ref >60.00)
GLUCOSE: 103 mg/dL — AB (ref 70–99)
Potassium: 4.5 mEq/L (ref 3.5–5.1)
SODIUM: 139 meq/L (ref 135–145)
TOTAL PROTEIN: 6.9 g/dL (ref 6.0–8.3)
Total Bilirubin: 0.5 mg/dL (ref 0.2–1.2)

## 2014-10-28 LAB — LDL CHOLESTEROL, DIRECT: LDL DIRECT: 129 mg/dL

## 2014-10-28 LAB — LIPID PANEL
CHOLESTEROL: 205 mg/dL — AB (ref 0–200)
HDL: 46.4 mg/dL (ref >39.00)
NonHDL: 158.6
Total CHOL/HDL Ratio: 4
Triglycerides: 252 mg/dL — ABNORMAL HIGH (ref 0.0–149.0)
VLDL: 50.4 mg/dL — ABNORMAL HIGH (ref 0.0–40.0)

## 2014-10-29 ENCOUNTER — Encounter: Payer: Self-pay | Admitting: *Deleted

## 2014-11-14 ENCOUNTER — Ambulatory Visit (INDEPENDENT_AMBULATORY_CARE_PROVIDER_SITE_OTHER): Payer: Medicare Other | Admitting: Cardiovascular Disease

## 2014-11-14 ENCOUNTER — Encounter: Payer: Self-pay | Admitting: Cardiovascular Disease

## 2014-11-14 VITALS — BP 155/100 | HR 71 | Ht <= 58 in | Wt 150.5 lb

## 2014-11-14 DIAGNOSIS — E785 Hyperlipidemia, unspecified: Secondary | ICD-10-CM

## 2014-11-14 DIAGNOSIS — I10 Essential (primary) hypertension: Secondary | ICD-10-CM

## 2014-11-14 MED ORDER — ATORVASTATIN CALCIUM 40 MG PO TABS: 40.0000 mg | ORAL_TABLET | Freq: Every day | ORAL | Status: DC

## 2014-11-14 MED ORDER — LOSARTAN POTASSIUM 100 MG PO TABS: 50.0000 mg | ORAL_TABLET | Freq: Every day | ORAL | Status: DC

## 2014-11-14 NOTE — Assessment & Plan Note (Signed)
She is on simvastatin 20 mg a day for her hyperlipidemia. Her most recent lipid levels showed her lipids are not adequately controlled. At this point we will stop the simvastatin and start her on atorvastatin 40 mg a day. We'll check fasting lipids, liver enzymes, and basic metabolic profile in 3 months. She may  follow-up with her medical doctor for further evaluation and management of her hyperlipidemia or I would be happy to help with the management also

## 2014-11-14 NOTE — Progress Notes (Signed)
Kathleen Jones Date of Birth  Dec 01, 1946       [facility] Office [address], [address] [place] [address], [place], [place]  [address]   [place], [place]  [address] [place]   Fax  [phone]     Fax [phone]  Problem List: 1. Dyspnea 2. Leg swelling 3. Hypertension 4. Hyperlipidemia 5. History of left breast cancer-status post irradiation and chemotherapy  History of Present Illness:  Kathleen Jones is a 68 yo with 2 month history of leg swelling and shortness of breath.  She occasionally has some episodes of chest discomfort/pressure.  She tends have chest pressure with exertion-for example carrying groceries in from the car.   Former smoker, Father had  Some heart problems.   August 06, 2014:  This is seen today as a work in visit.   I saw her earlier this year. We started her on lisinopril but this caused an ACE inhibitor cough. She was changed to losartan and the cough has greatly improved although it is not completely gone.   She had a normal morning.  She did not eat because she had a dental cleaning.  Her blood pressure was noted to be elevated at the dose. She was at her medical doctor's office and received some extra medication- clonidine.  Dec. 4, 2015:  [name] is feeling well. Brought her BP log.  Readings are variable.  His 190's and as low as 883 for systolic BP.   No cp , no dyspnea. Does not add salt to her food.  Eats country ham - ate it this am Eats bacon twice a week.   Salts the food as she is cooking .      Current Outpatient Prescriptions on File Prior to Visit  Medication Sig Dispense Refill  . ALPHA LIPOIC ACID PO Take 1 capsule by mouth daily.    Marland Kitchen aspirin 81 MG tablet Take 81 mg by mouth daily.     Marland Kitchen CINNAMON PO Take 1 capsule by mouth daily.    . diclofenac (VOLTAREN) 75 MG EC tablet TAKE ONE TABLET BY MOUTH TWICE DAILY 180 tablet 0  . docusate sodium (COLACE) 100 MG capsule Take 100 mg  by mouth as needed for constipation.    . hydrochlorothiazide (HYDRODIURIL) 25 MG tablet Take 1 tablet (25 mg total) by mouth daily. 90 tablet 0  . losartan (COZAAR) 50 MG tablet Take 1 tablet (50 mg total) by mouth daily. 90 tablet 3  . metoprolol succinate (TOPROL-XL) 50 MG 24 hr tablet Take 1 tablet (50 mg total) by mouth daily. 90 tablet 3  . milk thistle 175 MG tablet Take 175 mg by mouth daily.    Marland Kitchen nystatin cream (MYCOSTATIN) Apply 1 application topically 2 (two) times daily. 30 g 1  . simvastatin (ZOCOR) 20 MG tablet TAKE ONE TABLET BY MOUTH AT BEDTIME *Office visit with labs required for additional refills* 15 tablet 0   No current facility-administered medications on file prior to visit.    No Known Allergies  Past Medical History  Diagnosis Date  . High cholesterol   . Personal history of malignant neoplasm of breast 2010  . Arthritis   . Hypertension   . Cancer 2010     T1a, N0; ER neg/ PR neg, Her 2 neu 3+ wide local excision and sentinel node biopsy her for left breast cancer in April 03, 2009. 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 Sentinel nodes were negative. Adjuvant chemo and RT.    Past Surgical History  Procedure Laterality Date  . Breast lumpectomy Left 2010     wide local excision and sentinel node biopsy her for left breast cancer in April 03, 2009. 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 Sentinel nodes were negative  . Colonoscopy  2011    dR. eLLIOTT  . Tonsillectomy    . Abdominal hysterectomy  1983  . Abdominoplasty  1999  . Power port placement   2010  . Mm breast stereo bx*l*r/s Left 2010    History  Smoking status  . Former Smoker -- 1.50 packs/day for 15 years  . Types: Cigarettes  . Quit date: 01/23/1982  Smokeless tobacco  . Not on file    Comment: quit 1983    History  Alcohol Use  . Yes     Comment: occassionally    Family History  Problem Relation Age of Onset  . Breast cancer Paternal Aunt   . Ovarian cancer    . Colon cancer    . Hypertension Mother   . Hyperlipidemia Mother   . Hypertension Father     Reviw of Systems:  Reviewed in the HPI.  All other systems are negative.  Physical Exam: Blood pressure 222/108, pulse 71, height 4' 9.75" (1.467 m), weight 150 lb 8 oz (68.266 kg). Wt Readings from Last 3 Encounters:  11/14/14 150 lb 8 oz (68.266 kg)  08/06/14 149 lb 4 oz (67.699 kg)  08/06/14 149 lb (67.586 kg)     General: Well developed, well nourished, in no acute distress.  Head: Normocephalic, atraumatic, sclera non-icteric, mucus membranes are moist,   Neck: Supple. Carotids are 2 + without bruits. No JVD   Lungs: Clear   Heart: Rr, normal S1S2  Abdomen: Soft, non-tender, non-distended with normal bowel sounds.  Msk:  Strength and tone are normal   Extremities: No clubbing or cyanosis. No edema.  Distal pedal pulses are 2+ and equal    Neuro: CN II - XII intact.  Alert and oriented X 3.   Psych:  Normal   ECG: March 03, 2014:  NSR at 85, NS ST abnormality in the lateral leads.   Assessment / Plan:

## 2014-11-14 NOTE — Assessment & Plan Note (Signed)
It's clear that she still eats quite a bit of extra salt. She adds salt to all of her food when she's cooking. She also eats salty foods such as bacon and country ham for breakfast.  We had a long discussion regarding restricting her salt. I've advised her to start using a salt substitute.  I've encouraged her to exercise on a regular basis. We will also increase the losartan to 100 mg a day. We'll check labs in 3 months. I will see her in 6 months.

## 2014-11-14 NOTE — Patient Instructions (Signed)
Please increase losartan to 100 mg once daily  Please stop simvastatin Please start atorvastating 40 mg once daily  Please return in 3 months for fasting labs:  Lipid, liver, BMET  Your physician wants you to follow-up in: 6 months  You will receive a reminder letter in the mail two months in advance.  If you don't receive a letter, please call our office to schedule the follow-up appointment.

## 2014-11-17 ENCOUNTER — Other Ambulatory Visit: Payer: Self-pay

## 2014-11-17 NOTE — Telephone Encounter (Signed)
Dr. Acie Fredrickson should refill this if he has been managing her HTN.

## 2014-11-17 NOTE — Telephone Encounter (Signed)
Pt left v/m requesting refill HCTZ; pt was seen Dr Acie Fredrickson on 11/14/14 re: hypertension.Please advise.

## 2014-12-19 ENCOUNTER — Other Ambulatory Visit: Payer: Self-pay | Admitting: Family Medicine

## 2014-12-19 ENCOUNTER — Other Ambulatory Visit: Payer: Self-pay | Admitting: Cardiovascular Disease

## 2014-12-19 MED ORDER — HYDROCHLOROTHIAZIDE 25 MG PO TABS: 25.0000 mg | ORAL_TABLET | Freq: Every day | ORAL | Status: DC

## 2014-12-19 NOTE — Telephone Encounter (Signed)
Pt is out of medication, requesting refill.  Last note from Dr. Deborra Medina states that Dr. Acie Fredrickson should approve/deny refill on this medication since he is treating her HTN.

## 2015-02-06 ENCOUNTER — Other Ambulatory Visit: Payer: Self-pay | Admitting: *Deleted

## 2015-02-06 NOTE — Telephone Encounter (Signed)
Faxed refill request. Last Filled:    180 tablet 0 08/15/2014  Please advise.

## 2015-02-09 MED ORDER — DICLOFENAC SODIUM 75 MG PO TBEC: DELAYED_RELEASE_TABLET | ORAL | Status: DC

## 2015-02-13 ENCOUNTER — Other Ambulatory Visit (INDEPENDENT_AMBULATORY_CARE_PROVIDER_SITE_OTHER): Payer: Medicare Other

## 2015-02-13 DIAGNOSIS — I10 Essential (primary) hypertension: Secondary | ICD-10-CM

## 2015-02-17 ENCOUNTER — Encounter: Payer: Self-pay | Admitting: General Surgery

## 2015-02-19 ENCOUNTER — Other Ambulatory Visit: Payer: Self-pay | Admitting: Family Medicine

## 2015-02-19 DIAGNOSIS — Z0000 Encounter for general adult medical examination without abnormal findings: Secondary | ICD-10-CM

## 2015-02-23 ENCOUNTER — Other Ambulatory Visit (INDEPENDENT_AMBULATORY_CARE_PROVIDER_SITE_OTHER): Payer: Medicare Other

## 2015-02-23 DIAGNOSIS — I10 Essential (primary) hypertension: Secondary | ICD-10-CM | POA: Diagnosis not present

## 2015-02-23 DIAGNOSIS — Z0000 Encounter for general adult medical examination without abnormal findings: Secondary | ICD-10-CM

## 2015-02-23 DIAGNOSIS — E785 Hyperlipidemia, unspecified: Secondary | ICD-10-CM | POA: Diagnosis not present

## 2015-02-23 LAB — COMPREHENSIVE METABOLIC PANEL
ALT: 29 U/L (ref 0–35)
AST: 24 U/L (ref 0–37)
Albumin: 4.3 g/dL (ref 3.5–5.2)
Alkaline Phosphatase: 93 U/L (ref 39–117)
BILIRUBIN TOTAL: 0.5 mg/dL (ref 0.2–1.2)
BUN: 23 mg/dL (ref 6–23)
CO2: 34 mEq/L — ABNORMAL HIGH (ref 19–32)
CREATININE: 1 mg/dL (ref 0.40–1.20)
Calcium: 10.3 mg/dL (ref 8.4–10.5)
Chloride: 102 mEq/L (ref 96–112)
GFR: 58.31 mL/min — ABNORMAL LOW (ref >60.00)
Glucose, Bld: 105 mg/dL — ABNORMAL HIGH (ref 70–99)
Potassium: 4.5 mEq/L (ref 3.5–5.1)
Sodium: 141 mEq/L (ref 135–145)
Total Protein: 7 g/dL (ref 6.0–8.3)

## 2015-02-23 LAB — CBC WITH DIFFERENTIAL/PLATELET
BASOS ABS: 0 10*3/uL (ref 0.0–0.1)
Basophils Relative: 0.6 % (ref 0.0–3.0)
Eosinophils Absolute: 0.3 10*3/uL (ref 0.0–0.7)
Eosinophils Relative: 4.4 % (ref 0.0–5.0)
HEMATOCRIT: 39.4 % (ref 36.0–46.0)
Hemoglobin: 13.3 g/dL (ref 12.0–15.0)
Lymphocytes Relative: 29.2 % (ref 12.0–46.0)
Lymphs Abs: 2 10*3/uL (ref 0.7–4.0)
MCHC: 33.8 g/dL (ref 30.0–36.0)
MCV: 88.6 fl (ref 78.0–100.0)
MONO ABS: 0.5 10*3/uL (ref 0.1–1.0)
Monocytes Relative: 7.9 % (ref 3.0–12.0)
NEUTROS PCT: 57.9 % (ref 43.0–77.0)
Neutro Abs: 4 10*3/uL (ref 1.4–7.7)
PLATELETS: 203 10*3/uL (ref 150.0–400.0)
RBC: 4.44 Mil/uL (ref 3.87–5.11)
RDW: 13.8 % (ref 11.5–15.5)
WBC: 6.9 10*3/uL (ref 4.0–10.5)

## 2015-02-23 LAB — LIPID PANEL
CHOLESTEROL: 186 mg/dL (ref 0–200)
HDL: 40.6 mg/dL (ref >39.00)
NonHDL: 145.4
Total CHOL/HDL Ratio: 5
Triglycerides: 241 mg/dL — ABNORMAL HIGH (ref 0.0–149.0)
VLDL: 48.2 mg/dL — AB (ref 0.0–40.0)

## 2015-02-23 LAB — LDL CHOLESTEROL, DIRECT: LDL DIRECT: 104 mg/dL

## 2015-02-23 LAB — TSH: TSH: 2.29 u[IU]/mL (ref 0.35–4.50)

## 2015-02-26 ENCOUNTER — Ambulatory Visit (INDEPENDENT_AMBULATORY_CARE_PROVIDER_SITE_OTHER): Payer: Medicare Other | Admitting: Family Medicine

## 2015-02-26 ENCOUNTER — Encounter: Payer: Self-pay | Admitting: Family Medicine

## 2015-02-26 VITALS — BP 146/80 | HR 71 | Temp 98.0°F | Ht <= 58 in | Wt 150.5 lb

## 2015-02-26 DIAGNOSIS — I10 Essential (primary) hypertension: Secondary | ICD-10-CM

## 2015-02-26 DIAGNOSIS — Z0000 Encounter for general adult medical examination without abnormal findings: Secondary | ICD-10-CM

## 2015-02-26 DIAGNOSIS — N3946 Mixed incontinence: Secondary | ICD-10-CM

## 2015-02-26 DIAGNOSIS — E785 Hyperlipidemia, unspecified: Secondary | ICD-10-CM

## 2015-02-26 DIAGNOSIS — Z853 Personal history of malignant neoplasm of breast: Secondary | ICD-10-CM

## 2015-02-26 DIAGNOSIS — Z23 Encounter for immunization: Secondary | ICD-10-CM

## 2015-02-26 NOTE — Assessment & Plan Note (Signed)
Discussed options. After our discussion, she does want referral to urogyn- referral placed to see Dr. Zigmund Daniel at [facility].

## 2015-02-26 NOTE — Assessment & Plan Note (Signed)
The patients weight, height, BMI and visual acuity have been recorded in the chart I have made referrals, counseling and provided education to the patient based review of the above and I have provided the pt with a written personalized care plan for preventive services.  prevnar 13 today.

## 2015-02-26 NOTE — Patient Instructions (Signed)
Good to see you.  We will call you with your appointment with Dr. Zigmund Daniel.  Have a great trip!

## 2015-02-26 NOTE — Progress Notes (Addendum)
69 yo pleasant female  here for annual medicare wellness visit.  I have personally reviewed the Medicare Annual Wellness questionnaire and have noted 1. The patient's medical and social history 2. Their use of alcohol, tobacco or illicit drugs 3. Their current medications and supplements 4. The patient's functional ability including ADL's, fall risks, home safety risks and hearing or visual             impairment. 5. Diet and physical activities 6. Evidence for depression or mood disorders  End of life wishes discussed and updated in Social History.  The roster of all physicians providing medical care to patient - is listed in the Snapshot section of the chart.  Mammogram 04/01/14-already has appt scheduled for this April Colonoscopy 08/2010- Dr. Vira Agar Remote h/o hysterectomy Zoster 12/20/10 Pneumovax 12/20/10 Flu vaccine 10/2014 Skin exam scheduled for 03/2015- Dr. Stepheni Fetter Eye exam 09/2014- Dr. Mallie Mussel  Mixed incontinence- symptoms improved with vesicare 10 mg daily- Dr. Wendy Poet. Still has to wear a pad and it does affect her quality of life.  h/o breast CA- diagnosed April 2010, s/p lumpectomy, radiation and chemotherapy ( Paclitaxel x 12 weeks, herceptin x 1 year).  Doing well.  Followed by Dr. Ma Hillock every six months.  Had mammogram last year.Dr. Tollie Pizza orders that.  No residual side effects from the chemotherapy or radiation therapy. Rad onc is Dr. Baruch Gouty.  HTN- gets white coat HTN.  Saw Dr. Acie Fredrickson on 11/14/14- note reviewed. Losartan increased to 100 mg daily.  Also recommended that she cut back on sodium which she has done.  Also taking HCTZ 25 mg daily and Metoprolol 50 mg daily.    No HA, blurred vision, CP, SOB or LE edema. He also changed her cholesterol rx to atorvastatin 40 mg daily.   Lab Results  Component Value Date   CHOL 186 02/23/2015   HDL 40.60 02/23/2015   LDLCALC 104* 02/10/2014   LDLDIRECT 104.0 02/23/2015   TRIG 241.0* 02/23/2015   CHOLHDL 5  02/23/2015    Lab Results  Component Value Date   CREATININE 1.00 02/23/2015   Lab Results  Component Value Date   NA 141 02/23/2015   K 4.5 02/23/2015   CL 102 02/23/2015   CO2 34* 02/23/2015   Lab Results  Component Value Date   ALT 29 02/23/2015   AST 24 02/23/2015   ALKPHOS 93 02/23/2015   BILITOT 0.5 02/23/2015    ROS: See HPI Review of Systems  Constitutional: Negative.   HENT: Negative.   Eyes: Negative.   Respiratory: Negative.   Cardiovascular: Negative.   Gastrointestinal: Negative.   Genitourinary: Positive for urgency. Negative for dysuria.  Musculoskeletal: Negative.   Skin: Negative.   Allergic/Immunologic: Negative.   Neurological: Negative.   Hematological: Negative.   Psychiatric/Behavioral: Negative.   All other systems reviewed and are negative.    Physical exam: BP 146/80 mmHg  Pulse 71  Temp(Src) 98 F (36.7 C) (Oral)  Ht 4\' 10"  (1.473 m)  Wt 150 lb 8 oz (68.266 kg)  BMI 31.46 kg/m2  SpO2 97%  General:  Well-developed,well-nourished,in no acute distress; alert,appropriate and cooperative throughout examination Head:  normocephalic and atraumatic.   Eyes:  vision grossly intact, pupils equal, pupils round, and pupils reactive to light.   Ears:  R ear normal and L ear normal.   Nose:  no external deformity.   Mouth:  good dentition.   Lungs:  Normal respiratory effort, chest expands symmetrically. Lungs are clear to auscultation, no crackles or  wheezes. Heart:  Normal rate and regular rhythm. S1 and S2 normal without gallop, murmur, click, rub or other extra sounds. Abdomen:  Bowel sounds positive,abdomen soft and non-tender without masses, organomegaly or hernias noted. Msk:  No deformity or scoliosis noted of thoracic or lumbar spine.   Extremities:  No clubbing, cyanosis, edema, or deformity noted with normal full range of motion of all joints.   Neurologic:  alert & oriented X3 and gait normal.   Skin:  Intact without suspicious  lesions or rashes Psych:  Cognition and judgment appear intact. Alert and cooperative with normal attention span and concentration. No apparent delusions, illusions, hallucinations

## 2015-02-26 NOTE — Addendum Note (Signed)
Addended by: Modena Nunnery on: 02/26/2015 09:16 AM   Modules accepted: Orders

## 2015-02-26 NOTE — Progress Notes (Signed)
Pre visit review using our clinic review tool, if applicable. No additional management support is needed unless otherwise documented below in the visit note. 

## 2015-02-26 NOTE — Assessment & Plan Note (Signed)
Followed by onc- UTD.

## 2015-02-26 NOTE — Addendum Note (Signed)
Addended by: Lucille Passy on: 02/26/2015 09:58 AM   Modules accepted: Miquel Dunn

## 2015-02-26 NOTE — Assessment & Plan Note (Signed)
Well controlled on current rx. TG slightly elevated again- we discussed cutting back on carbs and sugars.

## 2015-02-26 NOTE — Assessment & Plan Note (Signed)
Improved on current rx. 

## 2015-03-30 ENCOUNTER — Encounter: Payer: Self-pay | Admitting: General Surgery

## 2015-03-31 ENCOUNTER — Other Ambulatory Visit: Payer: Self-pay | Admitting: *Deleted

## 2015-03-31 MED ORDER — METOPROLOL SUCCINATE ER 50 MG PO TB24: 50.0000 mg | ORAL_TABLET | Freq: Every day | ORAL | Status: DC

## 2015-04-06 ENCOUNTER — Encounter: Payer: Self-pay | Admitting: General Surgery

## 2015-04-06 ENCOUNTER — Ambulatory Visit (INDEPENDENT_AMBULATORY_CARE_PROVIDER_SITE_OTHER): Payer: Medicare Other | Admitting: General Surgery

## 2015-04-06 VITALS — BP 140/90 | HR 88 | Resp 14 | Ht <= 58 in | Wt 151.0 lb

## 2015-04-06 DIAGNOSIS — Z853 Personal history of malignant neoplasm of breast: Secondary | ICD-10-CM | POA: Diagnosis not present

## 2015-04-06 NOTE — Patient Instructions (Signed)
Continue self breast exams. Call office for any new breast issues or concerns. Return in one year.

## 2015-04-06 NOTE — Progress Notes (Signed)
Patient ID: Kathleen Jones, female   DOB: 09/27/1946, 69 y.o.   MRN: [id number]  Chief Complaint  Patient presents with  . Follow-up    mammogram    HPI Kathleen Jones is a 69 y.o. female who presents for a breast evaluation. The most recent mammogram was done on 04/06/15. Patient does perform regular self breast checks and gets regular mammograms done. She reports no new breast problems.   The patient reports she had significant discomfort during the mammogram procedure completed at [facility]. She also sports of bruise post procedure.  HPI  Past Medical History  Diagnosis Date  . High cholesterol   . Personal history of malignant neoplasm of breast 2010  . Arthritis   . Hypertension   . Cancer 2010     T1a, N0; ER neg/ PR neg, Her 2 neu 3+ wide local excision and sentinel node biopsy her for left breast cancer in April 03, 2009. 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 Sentinel nodes were negative. Adjuvant chemo and RT.    Past Surgical History  Procedure Laterality Date  . Breast lumpectomy Left 2010     wide local excision and sentinel node biopsy her for left breast cancer in April 03, 2009. 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 Sentinel nodes were negative  . Colonoscopy  2011    Dr. Vira Agar  . Tonsillectomy    . Abdominal hysterectomy  1983  . Abdominoplasty  1999  . Power port placement   2010  . Mm breast stereo bx*l*r/s Left 2010    Family History  Problem Relation Age of Onset  . Breast cancer Paternal Aunt   . Ovarian cancer    . Colon cancer    . Hypertension Mother   . Hyperlipidemia Mother   . Hypertension Father     Social History History  Substance Use Topics  . Smoking status: Former Smoker -- 1.50 packs/day for 15 years    Types: Cigarettes    Quit date: 01/23/1982  . Smokeless tobacco: Not on  file     Comment: quit 1983  . Alcohol Use: Yes     Comment: occassionally    No Known Allergies  Current Outpatient Prescriptions  Medication Sig Dispense Refill  . ALPHA LIPOIC ACID PO Take 1 capsule by mouth daily.    Marland Kitchen aspirin 81 MG tablet Take 81 mg by mouth daily.     Marland Kitchen atorvastatin (LIPITOR) 40 MG tablet Take 1 tablet (40 mg total) by mouth daily. 90 tablet 3  . CINNAMON PO Take 1 capsule by mouth daily.    . diclofenac (VOLTAREN) 75 MG EC tablet TAKE ONE TABLET BY MOUTH TWICE DAILY 180 tablet 0  . docusate sodium (COLACE) 100 MG capsule Take 100 mg by mouth as needed for constipation.    . hydrochlorothiazide (HYDRODIURIL) 25 MG tablet Take 1 tablet (25 mg total) by mouth daily. 90 tablet 3  . losartan (COZAAR) 100 MG tablet Take 0.5 tablets (50 mg total) by mouth daily. 90 tablet 3  . metoprolol succinate (TOPROL-XL) 50 MG 24 hr tablet Take 1 tablet (50 mg total) by mouth daily. 90 tablet 3  . nystatin cream (MYCOSTATIN) Apply 1 application topically 2 (two) times daily. (Patient taking differently: Apply 1 application topically as needed. ) 30 g 1  . VESICARE 10 MG tablet Take 10 mg by mouth daily.  No current facility-administered medications for this visit.    Review of Systems Review of Systems  Constitutional: Negative.   Respiratory: Negative.   Cardiovascular: Negative.     Blood pressure 140/90, pulse 88, resp. rate 14, height 4' 10"  (1.473 m), weight 151 lb (68.493 kg).  Physical Exam Physical Exam  Constitutional: She is oriented to person, place, and time. She appears well-developed and well-nourished.  Eyes: Conjunctivae are normal. No scleral icterus.  Neck: Neck supple.  Cardiovascular: Normal rate, regular rhythm and normal heart sounds.   Pulmonary/Chest: Effort normal and breath sounds normal. Right breast exhibits no inverted nipple, no mass, no nipple discharge, no skin change and no tenderness. Left breast exhibits no inverted nipple, no mass,  no nipple discharge, no skin change and no tenderness.  Left breast well healed incision from 10-2 o'clock  Lymphadenopathy:    She has no cervical adenopathy.    She has no axillary adenopathy.  Neurological: She is alert and oriented to person, place, and time.    Data Reviewed Bilateral diagnostic mammograms dated 03/27/2015 were reviewed and compared to previous studies. With the exception of some new dystrophic calcifications likely due to surgery/radiation no interval change.BI-RADS-2.  Assessment    Benign breast exam.    Plan    We'll arrange for a follow-up examination with bilateral mammograms in one year.    PCP: Dr Gardiner Sleeper 04/06/2015, 9:25 PM

## 2015-07-29 ENCOUNTER — Telehealth: Payer: Self-pay

## 2015-07-29 NOTE — Telephone Encounter (Signed)
Pt left v/m; pt last annual was 02/26/15; Dr Deborra Medina sent pt to Dr Acie Fredrickson and pt was started on Atorvastatin; pt has discussed with Dr Deborra Medina and pt understood Dr Deborra Medina would refill the Atorvastatin for pt. Pt said the last several days pt has developed a cough and last night pt had severe cough(pt is hoarse today due to cough) and pt wants to know if she should stop Atorvastatin. Pt request cb. [facility] [address].

## 2015-07-29 NOTE — Telephone Encounter (Signed)
Yes I can refill it but the cough may not be related to the atorvastatin at all.  Please ask her to make OV to be seen to evaluate this cough.

## 2015-07-29 NOTE — Telephone Encounter (Signed)
Spoke to pt and advised per Dr Deborra Medina. OV scheduled for 8/18@ 1045

## 2015-07-30 ENCOUNTER — Encounter: Payer: Self-pay | Admitting: Family Medicine

## 2015-07-30 ENCOUNTER — Ambulatory Visit (INDEPENDENT_AMBULATORY_CARE_PROVIDER_SITE_OTHER): Payer: Medicare Other | Admitting: Family Medicine

## 2015-07-30 ENCOUNTER — Encounter (INDEPENDENT_AMBULATORY_CARE_PROVIDER_SITE_OTHER): Payer: Self-pay

## 2015-07-30 VITALS — BP 178/102 | HR 67 | Temp 98.3°F | Wt 151.0 lb

## 2015-07-30 DIAGNOSIS — E785 Hyperlipidemia, unspecified: Secondary | ICD-10-CM | POA: Diagnosis not present

## 2015-07-30 DIAGNOSIS — R05 Cough: Secondary | ICD-10-CM

## 2015-07-30 DIAGNOSIS — I10 Essential (primary) hypertension: Secondary | ICD-10-CM

## 2015-07-30 DIAGNOSIS — R059 Cough, unspecified: Secondary | ICD-10-CM | POA: Insufficient documentation

## 2015-07-30 MED ORDER — HYDRALAZINE HCL 10 MG PO TABS: 10.0000 mg | ORAL_TABLET | Freq: Three times a day (TID) | ORAL | Status: DC

## 2015-07-30 MED ORDER — DICLOFENAC SODIUM 75 MG PO TBEC: DELAYED_RELEASE_TABLET | ORAL | Status: DC

## 2015-07-30 MED ORDER — SOLIFENACIN SUCCINATE 10 MG PO TABS: 10.0000 mg | ORAL_TABLET | Freq: Every day | ORAL | Status: DC

## 2015-07-30 NOTE — Assessment & Plan Note (Signed)
Deteriorated. D/c Losartan due to cough. Continue HCTZ and Metoprolol at current dose. Could not tolerate amlodipine. Start Hydralazine 10 mg three times daily. Follow up with me in 2 weeks. The patient indicates understanding of these issues and agrees with the plan.

## 2015-07-30 NOTE — Progress Notes (Signed)
Subjective:   Patient ID: Kathleen Jones, female    DOB: 01/07/1946, 69 y.o.   MRN: [id number]  Kathleen Jones is a pleasant 69 y.o. year old female who presents to clinic today with Cough  on 07/30/2015  HPI:  HTN- BP very elevated today. Taking Losartan 50 mg daily, HCTZ 25 mg daily and Toprol XL 50 mg daily. Lab Results  Component Value Date   CREATININE 1.00 02/23/2015    Had cough with ACEI that improved but never really resolved once she was switched to Losartan but lately dry cough is worsening again. Amlodipine caused swelling. Robitussin not helping with cough. No fevers or chills. No hemoptysis or chest pain.  Neg CXR 02/2014.   HLD- taking Lipitor 40 mg daily and is concerned that cough may be coming from Lipitor.  Current Outpatient Prescriptions on File Prior to Visit  Medication Sig Dispense Refill  . ALPHA LIPOIC ACID PO Take 1 capsule by mouth daily.    Marland Kitchen aspirin 81 MG tablet Take 81 mg by mouth daily.     Marland Kitchen atorvastatin (LIPITOR) 40 MG tablet Take 1 tablet (40 mg total) by mouth daily. 90 tablet 3  . CINNAMON PO Take 1 capsule by mouth daily.    Marland Kitchen docusate sodium (COLACE) 100 MG capsule Take 100 mg by mouth as needed for constipation.    . hydrochlorothiazide (HYDRODIURIL) 25 MG tablet Take 1 tablet (25 mg total) by mouth daily. 90 tablet 3  . metoprolol succinate (TOPROL-XL) 50 MG 24 hr tablet Take 1 tablet (50 mg total) by mouth daily. 90 tablet 3  . nystatin cream (MYCOSTATIN) Apply 1 application topically 2 (two) times daily. (Patient taking differently: Apply 1 application topically as needed. ) 30 g 1  . VESICARE 10 MG tablet Take 10 mg by mouth daily.      No current facility-administered medications on file prior to visit.    Allergies  Allergen Reactions  . Amlodipine     Leg swelling    Past Medical History  Diagnosis Date  . High cholesterol   . Personal history of malignant neoplasm of breast 2010  . Arthritis   . Hypertension   .  Cancer 2010     T1a, N0; ER neg/ PR neg, Her 2 neu 3+ wide local excision and sentinel node biopsy her for left breast cancer in April 03, 2009. 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 Sentinel nodes were negative. Adjuvant chemo and RT.    Past Surgical History  Procedure Laterality Date  . Breast lumpectomy Left 2010     wide local excision and sentinel node biopsy her for left breast cancer in April 03, 2009. 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 Sentinel nodes were negative  . Colonoscopy  2011    Dr. Vira Agar  . Tonsillectomy    . Abdominal hysterectomy  1983  . Abdominoplasty  1999  . Power port placement   2010  . Mm breast stereo bx*l*r/s Left 2010    Family History  Problem Relation Age of Onset  . Breast cancer Paternal Aunt   . Ovarian cancer    . Colon cancer    . Hypertension Mother   . Hyperlipidemia Mother   . Hypertension Father     Social History   Social History  . Marital Status: Married    Spouse Name: N/A  . Number of Children: N/A  .  Years of Education: N/A   Occupational History  . Not on file.   Social History Main Topics  . Smoking status: Former Smoker -- 1.50 packs/day for 15 years    Types: Cigarettes    Quit date: 01/23/1982  . Smokeless tobacco: Not on file     Comment: quit 1983  . Alcohol Use: Yes     Comment: occassionally  . Drug Use: No  . Sexual Activity: Not on file   Other Topics Concern  . Not on file   Social History Narrative   Has a Living Will.   Desires CPR.  Would not desire prolonged life support.            The PMH, PSH, Social History, Family History, Medications, and allergies have been reviewed in [facility], and have been updated if relevant.   Review of Systems  Constitutional: Negative.   HENT: Negative.   Eyes: Negative.   Respiratory: Positive for cough.  Negative for apnea, choking, chest tightness, shortness of breath, wheezing and stridor.   Cardiovascular: Negative.   Gastrointestinal: Negative.   Endocrine: Negative.   Genitourinary: Negative.   Musculoskeletal: Negative.   Skin: Negative.   Allergic/Immunologic: Negative.   Neurological: Negative.   Hematological: Negative.   Psychiatric/Behavioral: Negative.   All other systems reviewed and are negative.      Objective:    BP 178/102 mmHg  Pulse 67  Temp(Src) 98.3 F (36.8 C) (Oral)  Wt 151 lb (68.493 kg)  SpO2 97% BP Readings from Last 3 Encounters:  07/30/15 178/102  04/06/15 140/90  02/26/15 146/80     Physical Exam  Constitutional: She is oriented to person, place, and time. She appears well-developed and well-nourished. No distress.  HENT:  Head: Normocephalic and atraumatic.  Eyes: Conjunctivae are normal.  Cardiovascular: Normal rate and regular rhythm.   Pulmonary/Chest: Effort normal and breath sounds normal. No respiratory distress. She has no wheezes. She has no rales. She exhibits no tenderness.  Musculoskeletal: She exhibits no edema.  Neurological: She is alert and oriented to person, place, and time. No cranial nerve deficit.  Skin: Skin is warm and dry.  Psychiatric: She has a normal mood and affect. Her behavior is normal. Judgment and thought content normal.  Nursing note and vitals reviewed.         Assessment & Plan:   Essential hypertension  Cough  HLD (hyperlipidemia) No Follow-up on file.

## 2015-07-30 NOTE — Patient Instructions (Signed)
Good to see you. Please STOP taking losartan. Please start taking hydralazine 10 mg three times daily.  Come seem me on 8/31

## 2015-07-30 NOTE — Assessment & Plan Note (Signed)
Deteriorated. Very similar to her ACEI induced cough. STOP Losartan. See below.

## 2015-07-30 NOTE — Progress Notes (Signed)
Pre visit review using our clinic review tool, if applicable. No additional management support is needed unless otherwise documented below in the visit note. 

## 2015-09-03 ENCOUNTER — Encounter: Payer: Self-pay | Admitting: Family Medicine

## 2015-09-03 ENCOUNTER — Ambulatory Visit (INDEPENDENT_AMBULATORY_CARE_PROVIDER_SITE_OTHER): Payer: Medicare Other | Admitting: Family Medicine

## 2015-09-03 VITALS — BP 132/80 | HR 71 | Temp 98.0°F | Wt 150.5 lb

## 2015-09-03 DIAGNOSIS — Z23 Encounter for immunization: Secondary | ICD-10-CM | POA: Diagnosis not present

## 2015-09-03 DIAGNOSIS — I10 Essential (primary) hypertension: Secondary | ICD-10-CM | POA: Diagnosis not present

## 2015-09-03 NOTE — Assessment & Plan Note (Signed)
Well controlled on current rxs. No changes made- well tolerated. Call or return to clinic prn if these symptoms worsen or fail to improve as anticipated.

## 2015-09-03 NOTE — Progress Notes (Signed)
Subjective:   Patient ID: Kathleen Jones, female    DOB: 1946-05-03, 69 y.o.   MRN: [id number]  Kathleen Jones is a pleasant 69 y.o. year old female who presents to clinic today with Follow-up  on 09/03/2015  HPI:  HTN- Last month, d/c'd losartan due to cough.  Could not tolerate amlodipine so added Hydralazine 10 mg three times daily to HCTZ and metoprolol at previous doses.  Feels like she is tolerating this well.  Feels like BP has been under control.  Current Outpatient Prescriptions on File Prior to Visit  Medication Sig Dispense Refill  . ALPHA LIPOIC ACID PO Take 1 capsule by mouth daily.    Marland Kitchen aspirin 81 MG tablet Take 81 mg by mouth daily.     Marland Kitchen atorvastatin (LIPITOR) 40 MG tablet Take 1 tablet (40 mg total) by mouth daily. 90 tablet 3  . CINNAMON PO Take 1 capsule by mouth daily.    . diclofenac (VOLTAREN) 75 MG EC tablet TAKE ONE TABLET BY MOUTH TWICE DAILY 180 tablet 1  . docusate sodium (COLACE) 100 MG capsule Take 100 mg by mouth as needed for constipation.    . hydrALAZINE (APRESOLINE) 10 MG tablet Take 1 tablet (10 mg total) by mouth 3 (three) times daily. 90 tablet 1  . hydrochlorothiazide (HYDRODIURIL) 25 MG tablet Take 1 tablet (25 mg total) by mouth daily. 90 tablet 3  . metoprolol succinate (TOPROL-XL) 50 MG 24 hr tablet Take 1 tablet (50 mg total) by mouth daily. 90 tablet 3  . nystatin cream (MYCOSTATIN) Apply 1 application topically 2 (two) times daily. (Patient taking differently: Apply 1 application topically as needed. ) 30 g 1  . solifenacin (VESICARE) 10 MG tablet Take 1 tablet (10 mg total) by mouth daily. 30 tablet 3   No current facility-administered medications on file prior to visit.    Allergies  Allergen Reactions  . Amlodipine     Leg swelling    Past Medical History  Diagnosis Date  . High cholesterol   . Personal history of malignant neoplasm of breast 2010  . Arthritis   . Hypertension   . Cancer 2010     T1a, N0; ER neg/ PR neg, Her  2 neu 3+ wide local excision and sentinel node biopsy her for left breast cancer in April 03, 2009. 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 Sentinel nodes were negative. Adjuvant chemo and RT.    Past Surgical History  Procedure Laterality Date  . Breast lumpectomy Left 2010     wide local excision and sentinel node biopsy her for left breast cancer in April 03, 2009. 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 Sentinel nodes were negative  . Colonoscopy  2011    Dr. Vira Agar  . Tonsillectomy    . Abdominal hysterectomy  1983  . Abdominoplasty  1999  . Power port placement   2010  . Mm breast stereo bx*l*r/s Left 2010    Family History  Problem Relation Age of Onset  . Breast cancer Paternal Aunt   . Ovarian cancer    . Colon cancer    . Hypertension Mother   . Hyperlipidemia Mother   . Hypertension Father     Social History   Social History  . Marital Status: Married    Spouse Name: N/A  . Number of Children: N/A  . Years of Education: N/A  Occupational History  . Not on file.   Social History Main Topics  . Smoking status: Former Smoker -- 1.50 packs/day for 15 years    Types: Cigarettes    Quit date: 01/23/1982  . Smokeless tobacco: Not on file     Comment: quit 1983  . Alcohol Use: Yes     Comment: occassionally  . Drug Use: No  . Sexual Activity: Not on file   Other Topics Concern  . Not on file   Social History Narrative   Has a Living Will.   Desires CPR.  Would not desire prolonged life support.            The PMH, PSH, Social History, Family History, Medications, and allergies have been reviewed in [facility], and have been updated if relevant.   Review of Systems  Constitutional: Negative.   HENT: Negative.   Eyes: Negative.   Respiratory: Negative.   Cardiovascular: Negative.   Gastrointestinal: Negative.     Endocrine: Negative.   Genitourinary: Negative.   Musculoskeletal: Negative.   Skin: Negative.   Allergic/Immunologic: Negative.   Neurological: Negative.   Hematological: Negative.   Psychiatric/Behavioral: Negative.   All other systems reviewed and are negative.      Objective:    BP 132/80 mmHg  Pulse 71  Temp(Src) 98 F (36.7 C) (Oral)  Wt 150 lb 8 oz (68.266 kg)  SpO2 99%   Physical Exam  Constitutional: She is oriented to person, place, and time. She appears well-developed and well-nourished. No distress.  HENT:  Head: Normocephalic and atraumatic.  Eyes: Conjunctivae are normal.  Cardiovascular: Normal rate and regular rhythm.   Pulmonary/Chest: Effort normal and breath sounds normal. No respiratory distress.  Musculoskeletal: Normal range of motion. She exhibits no edema.  Neurological: She is alert and oriented to person, place, and time. No cranial nerve deficit.  Skin: Skin is warm and dry.  Psychiatric: She has a normal mood and affect. Her behavior is normal. Judgment and thought content normal.  Nursing note and vitals reviewed.         Assessment & Plan:   Essential hypertension  Need for influenza vaccination - Plan: Flu Vaccine QUAD 36+ mos PF IM (Fluarix & Fluzone Quad PF) No Follow-up on file.

## 2015-09-03 NOTE — Progress Notes (Signed)
Pre visit review using our clinic review tool, if applicable. No additional management support is needed unless otherwise documented below in the visit note. 

## 2015-09-17 HISTORY — PX: INCONTINENCE SURGERY: SHX676

## 2015-09-21 ENCOUNTER — Other Ambulatory Visit: Payer: Self-pay | Admitting: Family Medicine

## 2015-09-21 NOTE — Telephone Encounter (Signed)
Pt called requesting refill on hydrALAZINE (APRESOLINE) 10 MG tablet, last refilled 07/30/15 #90.  Called pt back, explained we just received request from [facility] today 09/21/15.  She WCB tomorrow if they state she does not have refill.

## 2015-10-15 ENCOUNTER — Other Ambulatory Visit: Payer: Self-pay | Admitting: Cardiovascular Disease

## 2015-11-27 ENCOUNTER — Other Ambulatory Visit: Payer: Self-pay

## 2015-11-27 MED ORDER — HYDRALAZINE HCL 10 MG PO TABS: 10.0000 mg | ORAL_TABLET | Freq: Three times a day (TID) | ORAL | Status: DC

## 2015-11-27 MED ORDER — ATORVASTATIN CALCIUM 40 MG PO TABS: 40.0000 mg | ORAL_TABLET | Freq: Every day | ORAL | Status: DC

## 2015-11-27 NOTE — Telephone Encounter (Signed)
Pt request refill atorvastatin and hydralazine to [facility] [address]. Pt last seen 08/2015 and pt will cb to schedule annual exam 02/2016. Refill done per protocol and pt will ck with pharmacy.

## 2015-12-17 ENCOUNTER — Other Ambulatory Visit: Payer: Self-pay

## 2015-12-17 ENCOUNTER — Other Ambulatory Visit: Payer: Self-pay | Admitting: Cardiovascular Disease

## 2015-12-17 MED ORDER — HYDRALAZINE HCL 10 MG PO TABS: 10.0000 mg | ORAL_TABLET | Freq: Three times a day (TID) | ORAL | Status: DC

## 2015-12-17 NOTE — Telephone Encounter (Signed)
Pt request 90 day refill hydralazine to [facility] [address]. Advised pt done and pt will cb to schedule CPX prior to being out of med. Spoke with Alyse Low at [address] and cancelled 30 day refills left on hydralazine.

## 2016-01-12 ENCOUNTER — Other Ambulatory Visit: Payer: Self-pay | Admitting: *Deleted

## 2016-01-12 DIAGNOSIS — Z853 Personal history of malignant neoplasm of breast: Secondary | ICD-10-CM

## 2016-01-20 ENCOUNTER — Encounter: Payer: Self-pay | Admitting: *Deleted

## 2016-01-26 ENCOUNTER — Encounter: Payer: Self-pay | Admitting: Internal Medicine

## 2016-01-26 ENCOUNTER — Ambulatory Visit (INDEPENDENT_AMBULATORY_CARE_PROVIDER_SITE_OTHER): Payer: Medicare Other | Admitting: Internal Medicine

## 2016-01-26 VITALS — BP 150/98 | HR 94 | Temp 100.0°F | Wt 152.0 lb

## 2016-01-26 DIAGNOSIS — J019 Acute sinusitis, unspecified: Secondary | ICD-10-CM

## 2016-01-26 MED ORDER — BENZONATATE 200 MG PO CAPS: 200.0000 mg | ORAL_CAPSULE | Freq: Two times a day (BID) | ORAL | Status: DC | PRN

## 2016-01-26 MED ORDER — AMOXICILLIN 500 MG PO CAPS: 500.0000 mg | ORAL_CAPSULE | Freq: Three times a day (TID) | ORAL | Status: DC

## 2016-01-26 NOTE — Progress Notes (Signed)
Pre visit review using our clinic review tool, if applicable. No additional management support is needed unless otherwise documented below in the visit note. 

## 2016-01-26 NOTE — Patient Instructions (Signed)

## 2016-01-26 NOTE — Progress Notes (Signed)
HPI  Pt presents to the clinic today with c/o watery eyes, runny nose, nasal congestion, sore throat and cough. This started 3 weeks ago. She is not able to blow anything out of her nose. The cough is non productive. She denies shortness of breath. She is running low grade fevers, had chills and body aches. She has tried Mucinex and Delsym with minimal relief. She has a history of seasonal allergies but reports this feels worse. She has not had sick contacts that she is aware of.  Review of Systems    Past Medical History  Diagnosis Date  . High cholesterol   . Personal history of malignant neoplasm of breast 2010  . Arthritis   . Hypertension   . Cancer ([place]) 2010     T1a, N0; ER neg/ PR neg, Her 2 neu 3+ wide local excision and sentinel node biopsy her for left breast cancer in April 03, 2009. 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 Sentinel nodes were negative. Adjuvant chemo and RT.    Family History  Problem Relation Age of Onset  . Breast cancer Paternal Aunt   . Ovarian cancer    . Colon cancer    . Hypertension Mother   . Hyperlipidemia Mother   . Hypertension Father     Social History   Social History  . Marital Status: Married    Spouse Name: N/A  . Number of Children: N/A  . Years of Education: N/A   Occupational History  . Not on file.   Social History Main Topics  . Smoking status: Former Smoker -- 1.50 packs/day for 15 years    Types: Cigarettes    Quit date: 01/23/1982  . Smokeless tobacco: Not on file     Comment: quit 1983  . Alcohol Use: Yes     Comment: occassionally  . Drug Use: No  . Sexual Activity: Not on file   Other Topics Concern  . Not on file   Social History Narrative   Has a Living Will.   Desires CPR.  Would not desire prolonged life support.             Allergies  Allergen Reactions  . Amlodipine     Leg swelling     Constitutional: Positive  headache, fatigue and fever. Denies abrupt weight changes.  HEENT:  Positive runny nose, nasal congestion and sore throat. Denies eye redness, ear pain, ringing in the ears, wax buildup, or bloody nose. Respiratory: Positive cough. Denies difficulty breathing or shortness of breath.  Cardiovascular: Denies chest pain, chest tightness, palpitations or swelling in the hands or feet.   No other specific complaints in a complete review of systems (except as listed in HPI above).  Objective:   BP 150/98 mmHg  Pulse 94  Temp(Src) 100 F (37.8 C) (Oral)  Wt 152 lb (68.947 kg)  SpO2 96%  General: Appears her stated age, ill appearing in NAD. HEENT: Head: normal shape and size, no sinus tenderness noted; Eyes: sclera white, no icterus, conjunctiva pink; Ears: bilateral cerumen impaction; Nose: mucosa boggy and moist, septum midline; Throat/Mouth: + PND. Teeth present, mucosa erythematous and moist, no exudate noted, no lesions or ulcerations noted.  Neck:  No adenopathy noted.  Cardiovascular: Normal rate and rhythm. S1,S2 noted.  No murmur, rubs or gallops noted.Pulmonary/Chest: Normal effort and diminshed vesicular breath sounds. No respiratory distress. No wheezes, rales or ronchi noted.      Assessment & Plan:  Acute bacterial sinusitis  Can use a Neti Pot which can be purchased from your local drug store. nasocort 2 sprays each nostril for 3 days and then as needed. Amoxil TID for 10 days eRx for Tessalon Pearls for cough  RTC as needed or if symptoms persist.

## 2016-02-17 ENCOUNTER — Telehealth: Payer: Self-pay | Admitting: Family Medicine

## 2016-02-17 NOTE — Telephone Encounter (Signed)
Pt would like return phone call - cb number is [phone] Thanks

## 2016-02-17 NOTE — Telephone Encounter (Signed)
Returned phone call. Scheduled AWV appt.

## 2016-02-24 ENCOUNTER — Telehealth: Payer: Self-pay | Admitting: Family Medicine

## 2016-02-24 ENCOUNTER — Ambulatory Visit (INDEPENDENT_AMBULATORY_CARE_PROVIDER_SITE_OTHER): Payer: Medicare Other

## 2016-02-24 ENCOUNTER — Other Ambulatory Visit (INDEPENDENT_AMBULATORY_CARE_PROVIDER_SITE_OTHER): Payer: Medicare Other

## 2016-02-24 ENCOUNTER — Telehealth: Payer: Self-pay

## 2016-02-24 VITALS — BP 212/114 | HR 78 | Temp 97.6°F | Ht 59.0 in | Wt 155.2 lb

## 2016-02-24 DIAGNOSIS — E785 Hyperlipidemia, unspecified: Secondary | ICD-10-CM | POA: Diagnosis not present

## 2016-02-24 DIAGNOSIS — Z0000 Encounter for general adult medical examination without abnormal findings: Secondary | ICD-10-CM

## 2016-02-24 DIAGNOSIS — I10 Essential (primary) hypertension: Secondary | ICD-10-CM

## 2016-02-24 DIAGNOSIS — Z1159 Encounter for screening for other viral diseases: Secondary | ICD-10-CM | POA: Diagnosis not present

## 2016-02-24 LAB — CBC WITH DIFFERENTIAL/PLATELET
BASOS ABS: 0 10*3/uL (ref 0.0–0.1)
Basophils Relative: 0.7 % (ref 0.0–3.0)
EOS ABS: 0.3 10*3/uL (ref 0.0–0.7)
Eosinophils Relative: 5.1 % — ABNORMAL HIGH (ref 0.0–5.0)
HEMATOCRIT: 38.4 % (ref 36.0–46.0)
HEMOGLOBIN: 12.7 g/dL (ref 12.0–15.0)
LYMPHS PCT: 31.6 % (ref 12.0–46.0)
Lymphs Abs: 2 10*3/uL (ref 0.7–4.0)
MCHC: 33 g/dL (ref 30.0–36.0)
MCV: 88.9 fl (ref 78.0–100.0)
MONOS PCT: 8.1 % (ref 3.0–12.0)
Monocytes Absolute: 0.5 10*3/uL (ref 0.1–1.0)
Neutro Abs: 3.5 10*3/uL (ref 1.4–7.7)
Neutrophils Relative %: 54.5 % (ref 43.0–77.0)
PLATELETS: 234 10*3/uL (ref 150.0–400.0)
RBC: 4.32 Mil/uL (ref 3.87–5.11)
RDW: 15.3 % (ref 11.5–15.5)
WBC: 6.5 10*3/uL (ref 4.0–10.5)

## 2016-02-24 LAB — COMPREHENSIVE METABOLIC PANEL
ALK PHOS: 97 U/L (ref 39–117)
ALT: 47 U/L — AB (ref 0–35)
AST: 41 U/L — AB (ref 0–37)
Albumin: 4.4 g/dL (ref 3.5–5.2)
BILIRUBIN TOTAL: 0.3 mg/dL (ref 0.2–1.2)
BUN: 22 mg/dL (ref 6–23)
CO2: 31 meq/L (ref 19–32)
CREATININE: 0.88 mg/dL (ref 0.40–1.20)
Calcium: 10 mg/dL (ref 8.4–10.5)
Chloride: 99 mEq/L (ref 96–112)
GFR: 67.39 mL/min (ref >60.00)
GLUCOSE: 123 mg/dL — AB (ref 70–99)
Potassium: 3.8 mEq/L (ref 3.5–5.1)
SODIUM: 138 meq/L (ref 135–145)
TOTAL PROTEIN: 7.1 g/dL (ref 6.0–8.3)

## 2016-02-24 LAB — LIPID PANEL
Cholesterol: 177 mg/dL (ref 0–200)
HDL: 42.6 mg/dL (ref >39.00)
NONHDL: 134.79
Total CHOL/HDL Ratio: 4
Triglycerides: 277 mg/dL — ABNORMAL HIGH (ref 0.0–149.0)
VLDL: 55.4 mg/dL — ABNORMAL HIGH (ref 0.0–40.0)

## 2016-02-24 LAB — LDL CHOLESTEROL, DIRECT: LDL DIRECT: 91 mg/dL

## 2016-02-24 LAB — TSH: TSH: 0.63 u[IU]/mL (ref 0.35–4.50)

## 2016-02-24 MED ORDER — HYDRALAZINE HCL 25 MG PO TABS: 25.0000 mg | ORAL_TABLET | Freq: Three times a day (TID) | ORAL | Status: DC

## 2016-02-24 NOTE — Patient Instructions (Signed)
Kathleen Jones , Thank you for taking time to come for your Medicare Wellness Visit. I appreciate your ongoing commitment to your health goals. Please review the following plan we discussed and let me know if I can assist you in the future.   These are the goals we discussed: Goals    Starting 02/25/2016, I will decrease intake of sugar to 1 svg per day.       This is a list of the screening recommended for you and due dates:  Health Maintenance  Topic Date Due  .  Hepatitis C: One time screening is recommended by Center for Disease Control  (CDC) for  adults born from [birth year] through 1966.   Completed  . DEXA scan (bone density measurement)  Will discuss at CPE  . Flu Shot  07/12/2016  . Mammogram  03/26/2017  . Tetanus Vaccine  01/08/2019  . Colon Cancer Screening  08/31/2020  . Shingles Vaccine  Completed  . Pneumonia vaccines  Completed   Preventive Care for Adults  A healthy lifestyle and preventive care can promote health and wellness. Preventive health guidelines for adults include the following key practices.  . A routine yearly physical is a good way to check with your health care provider about your health and preventive screening. It is a chance to share any concerns and updates on your health and to receive a thorough exam.  . Visit your dentist for a routine exam and preventive care every 6 months. Brush your teeth twice a day and floss once a day. Good oral hygiene prevents tooth decay and gum disease.  . The frequency of eye exams is based on your age, health, family medical history, use  of contact lenses, and other factors. Follow your health care provider's ecommendations for frequency of eye exams.  . Eat a healthy diet. Foods like vegetables, fruits, whole grains, low-fat dairy products, and lean protein foods contain the nutrients you need without too many calories. Decrease your intake of foods high in solid fats, added sugars, and salt. Eat the right amount of  calories for you. Get information about a proper diet from your health care provider, if necessary.  . Regular physical exercise is one of the most important things you can do for your health. Most adults should get at least 150 minutes of moderate-intensity exercise (any activity that increases your heart rate and causes you to sweat) each week. In addition, most adults need muscle-strengthening exercises on 2 or more days a week.  Silver Sneakers may be a benefit available to you. To determine eligibility, you may visit the website: www.silversneakers.com or contact program at 785-346-5854 Mon-Fri between 8AM-8PM.   . Maintain a healthy weight. The body mass index (BMI) is a screening tool to identify possible weight problems. It provides an estimate of body fat based on height and weight. Your health care provider can find your BMI and can help you achieve or maintain a healthy weight.   For adults 20 years and older: ? A BMI below 18.5 is considered underweight. ? A BMI of 18.5 to 24.9 is normal. ? A BMI of 25 to 29.9 is considered overweight. ? A BMI of 30 and above is considered obese.   . Maintain normal blood lipids and cholesterol levels by exercising and minimizing your intake of saturated fat. Eat a balanced diet with plenty of fruit and vegetables. Blood tests for lipids and cholesterol should begin at age 52 and be repeated every 5 years. If  your lipid or cholesterol levels are high, you are over 50, or you are at high risk for heart disease, you may need your cholesterol levels checked more frequently. Ongoing high lipid and cholesterol levels should be treated with medicines if diet and exercise are not working.  . If you smoke, find out from your health care provider how to quit. If you do not use tobacco, please do not start.  . If you choose to drink alcohol, please do not consume more than 2 drinks per day. One drink is considered to be 12 ounces (355 mL) of beer, 5 ounces  (148 mL) of wine, or 1.5 ounces (44 mL) of liquor.  . If you are 68-51 years old, ask your health care provider if you should take aspirin to prevent strokes.  . Use sunscreen. Apply sunscreen liberally and repeatedly throughout the day. You should seek shade when your shadow is shorter than you. Protect yourself by wearing long sleeves, pants, a wide-brimmed hat, and sunglasses year round, whenever you are outdoors.  . Once a month, do a whole body skin exam, using a mirror to look at the skin on your back. Tell your health care provider of new moles, moles that have irregular borders, moles that are larger than a pencil eraser, or moles that have changed in shape or color.

## 2016-02-24 NOTE — Progress Notes (Addendum)
I reviewed health advisor's note, was available for consultation, and agree with documentation and plan.   Patient with hypertensive urgency during her wellness visit with health advisor, we recommended same day evaluation with provider and patient declined this. See phone note for further plan. Advised close f/u with PCP already scheduled Monday, to ER if signs of hypertensive emergency.

## 2016-02-24 NOTE — Telephone Encounter (Signed)
Patient called and I let her know Dr.Gutierrez' comments.  I let patient know to increase medication to 25 mg  Three times a day and it was called in to the pharmacy.  Patient said when she got home her blood pressure was 149/64.

## 2016-02-24 NOTE — Telephone Encounter (Signed)
Forwarded to Dr. Deborra Medina for [place].

## 2016-02-24 NOTE — Progress Notes (Signed)
During AWV, patient's BP was elevated with final reading of 212/114. Medical director Dr. Ria Bush was consulted. Patient declined same-day acute visit with another provider stating she felt fine and her BP was always elevated.

## 2016-02-24 NOTE — Telephone Encounter (Signed)
Returned patient's phone call. Patient states she took BP at [facility] and it was 149/64. Pt stated she had received new medication order regarding Hydralazine and would be picking it up today.

## 2016-02-24 NOTE — Progress Notes (Signed)
Subjective:   Kathleen Jones is a 70 y.o. female who presents for Medicare Annual (Subsequent) preventive examination.   Cardiac Risk Factors include: advanced age (>60mn, >>61women);dyslipidemia;hypertension;obesity (BMI >30kg/m2)     Objective:     Vitals: BP 212/114 mmHg  Pulse 78  Temp(Src) 97.6 F (36.4 C) (Oral)  Ht 4' 11"  (1.499 m)  Wt 155 lb 4 oz (70.421 kg)  BMI 31.34 kg/m2  SpO2 96%  Tobacco History  Smoking status  . Former Smoker -- 1.50 packs/day for 15 years  . Types: Cigarettes  . Quit date: 01/23/1982  Smokeless tobacco  . Not on file    Comment: quit 1983     Counseling given: No   Past Medical History  Diagnosis Date  . High cholesterol   . Personal history of malignant neoplasm of breast 2010  . Arthritis   . Hypertension   . Cancer ([place] 2010     T1a, N0; ER neg/ PR neg, Her 2 neu 3+ wide local excision and sentinel node biopsy her for left breast cancer in April 03, 2009. 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 Sentinel nodes were negative. Adjuvant chemo and RT.   Past Surgical History  Procedure Laterality Date  . Breast lumpectomy Left 2010     wide local excision and sentinel node biopsy her for left breast cancer in April 03, 2009. 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 Sentinel nodes were negative  . Colonoscopy  2011    Dr. EVira Agar . Tonsillectomy    . Abdominal hysterectomy  1983  . Abdominoplasty  1999  . Power port placement   2010  . Mm breast stereo bx*l*r/s Left 2010  . Incontinence surgery N/A September 17, 2015   Family History  Problem Relation Age of Onset  . Breast cancer Paternal Aunt   . Ovarian cancer    . Colon cancer    . Hypertension Mother   . Hyperlipidemia Mother   . Hypertension Father    History  Sexual Activity  . Sexual Activity: Yes    Outpatient  Encounter Prescriptions as of 02/24/2016  Medication Sig  . ALPHA LIPOIC ACID PO Take 1 capsule by mouth daily.  .Marland Kitchenaspirin 81 MG tablet Take 81 mg by mouth daily.   .Marland Kitchenatorvastatin (LIPITOR) 40 MG tablet Take 1 tablet (40 mg total) by mouth daily.  .Marland KitchenCINNAMON PO Take 1 capsule by mouth daily.  . diclofenac (VOLTAREN) 75 MG EC tablet TAKE ONE TABLET BY MOUTH TWICE DAILY  . docusate sodium (COLACE) 100 MG capsule Take 100 mg by mouth as needed for constipation.  . hydrALAZINE (APRESOLINE) 10 MG tablet Take 1 tablet (10 mg total) by mouth 3 (three) times daily.  . hydrochlorothiazide (HYDRODIURIL) 25 MG tablet TAKE ONE TABLET BY MOUTH ONCE DAILY  . metoprolol succinate (TOPROL-XL) 50 MG 24 hr tablet Take 1 tablet (50 mg total) by mouth daily.  .Marland Kitchennystatin cream (MYCOSTATIN) Apply 1 application topically 2 (two) times daily. (Patient taking differently: Apply 1 application topically as needed. )  . amoxicillin (AMOXIL) 500 MG capsule Take 1 capsule (500 mg total) by mouth 3 (three) times daily. (Patient not taking: Reported on 02/24/2016)  . benzonatate (TESSALON) 200 MG capsule Take 1 capsule (200 mg total) by mouth 2 (two) times daily as needed for cough. (Patient not taking: Reported on 02/24/2016)  . solifenacin (VESICARE) 10 MG  tablet Take 1 tablet (10 mg total) by mouth daily. (Patient not taking: Reported on 02/24/2016)   No facility-administered encounter medications on file as of 02/24/2016.    Activities of Daily Living In your present state of health, do you have any difficulty performing the following activities: 02/24/2016  Hearing? N  Vision? N  Difficulty concentrating or making decisions? N  Walking or climbing stairs? N  Dressing or bathing? N  Doing errands, shopping? N  Preparing Food and eating ? N  Using the Toilet? N  In the past six months, have you accidently leaked urine? Y  Do you have problems with loss of bowel control? N  Managing your Medications? N  Managing your  Finances? N    Patient Care Team: Lucille Passy, MD as PCP - General Robert Bellow, MD (General Surgery) Thayer Headings, MD as Consulting Physician (Cardiology) Leia Alf, MD as Attending Physician (Internal Medicine) Marti Sleigh, MD as Consulting Physician (Obstetrics and Gynecology)  Juanda Crumble, MD - Hoskins, MD - Dermatology    Assessment:     Hearing Screening   125Hz  250Hz  500Hz  1000Hz  2000Hz  4000Hz  8000Hz   Right ear:   40 40 40 40   Left ear:   40 40 40 40   Vision Screening Comments: Last eye exam 09/2015 with Dr. Mallie Mussel  Exercise Activities and Dietary recommendations Current Exercise Habits: Home exercise routine, Type of exercise: walking, Time (Minutes): 30, Frequency (Times/Week): 2, Weekly Exercise (Minutes/Week): 60, Intensity: Mild  Goals    . Reduce sugar intake to X grams per day     Starting 02/25/2016, I will decrease intake of sugar to 1 svg per day.      Fall Risk Fall Risk  02/24/2016 02/26/2015 02/17/2014 02/13/2013  Falls in the past year? No No No No   Depression Screen PHQ 2/9 Scores 02/24/2016 02/26/2015 02/17/2014 02/13/2013  PHQ - 2 Score 0 0 0 0     Cognitive Testing MMSE - Mini Mental State Exam 02/24/2016  Orientation to time 5  Orientation to Place 5  Registration 3  Attention/ Calculation 5  Recall 3  Language- name 2 objects 0  Language- repeat 1  Language- follow 3 step command 3  Language- read & follow direction 1  Write a sentence 0  Copy design 0  Total score 26    Immunization History  Administered Date(s) Administered  . Influenza Split 11/13/2011, 09/03/2012  . Influenza,inj,Quad PF,36+ Mos 09/03/2015  . Influenza-Unspecified 11/11/2013, 10/12/2014  . Pneumococcal Conjugate-13 02/26/2015  . Pneumococcal Polysaccharide-23 12/20/2010  . Zoster 12/20/2010   Screening Tests Health Maintenance  Topic Date Due  . DEXA SCAN  Postponed - will discuss at CPE  . INFLUENZA VACCINE  07/12/2016  .  MAMMOGRAM  03/26/2017  . TETANUS/TDAP  01/08/2019  . COLONOSCOPY  08/31/2020  . ZOSTAVAX  Completed  . Hepatitis C Screening  Completed  . PNA vac Low Risk Adult  Completed      Plan:     I have personally reviewed the Medicare Annual Wellness questionnaire and have noted the following in the patient's chart:  A. Medical and social history B. Use of alcohol, tobacco or illicit drugs  C. Current medications and supplements D. Functional ability and status E.  Nutritional status F.  Physical activity G. Advance directives H. List of other physicians I.  Hospitalizations, surgeries, and ER visits in previous 12 months J.  Vitals K. Screenings to include hearing, vision, cognitive,  depression L. Referrals and appointments - none  In addition, I reviewed preventive protocols, quality metrics, and best practice recommendations specific to patient. A written personalized care plan for preventive services as well as general preventive health recommendations were provided to patient. Due to patient's risk for metabolic syndrome, a discussion was held with patient about her current risk factors and ways to reduce risk. Educational literature was also provided from [facility] regarding metabolic syndrome.   See attached scanned questionnaire for additional information.   Signed,   Lindell Noe, MHA, BS, LPN Health Advisor 06/17/6150

## 2016-02-24 NOTE — Telephone Encounter (Signed)
Pt refused same day appointment. plz call - recommend increase hydralazine to 25mg  TID - new dose sent to pharmacy.  Monitor blood pressures at home and keep appt with PCP on Monday. To ER or urgent care if any high blood pressure symptoms - headache, chest pain or tightness, shortness of breath, vision changes or stroke symptoms. Will cc Maudie Mercury and [name]

## 2016-02-24 NOTE — Telephone Encounter (Signed)
noted 

## 2016-02-24 NOTE — Telephone Encounter (Signed)
-----   Message from [name], LPN sent at [id number] 12:24 PM EDT ----- Regarding: ELEVATED BP Dr. Deborra Medina,  Pt had elevated BP during AWV.   190/110 @ 8:45 (taken by me) 212/114 @ 8:49 (taken by [name])  Pt denied having any abnormal symptoms and stated she felt fine. Patient declined a same-day appt. Dr. Darnell Level was consulted about patient during visit.  [name]

## 2016-02-24 NOTE — Progress Notes (Signed)
Pre visit review using our clinic review tool, if applicable. No additional management support is needed unless otherwise documented below in the visit note. 

## 2016-02-25 LAB — HEPATITIS C ANTIBODY: HCV AB: NEGATIVE

## 2016-02-29 ENCOUNTER — Ambulatory Visit: Payer: Medicare Other | Admitting: Family Medicine

## 2016-02-29 DIAGNOSIS — Z01419 Encounter for gynecological examination (general) (routine) without abnormal findings: Secondary | ICD-10-CM | POA: Insufficient documentation

## 2016-02-29 DIAGNOSIS — Z0000 Encounter for general adult medical examination without abnormal findings: Secondary | ICD-10-CM | POA: Insufficient documentation

## 2016-03-08 ENCOUNTER — Other Ambulatory Visit: Payer: Self-pay | Admitting: Family Medicine

## 2016-03-08 NOTE — Telephone Encounter (Signed)
Pt was requesting refill hydralazine 25mg ; advised pt too early to call in. Pt will cb when closer time to refill; pt said BP is doing wonderfully; pt did not have exact readings with her. Pt will cb when time to renew.

## 2016-04-04 ENCOUNTER — Other Ambulatory Visit: Payer: Medicare Other

## 2016-04-04 ENCOUNTER — Ambulatory Visit: Admission: RE | Admit: 2016-04-04 | Payer: Medicare Other | Source: Ambulatory Visit

## 2016-04-07 ENCOUNTER — Other Ambulatory Visit: Payer: Self-pay | Admitting: Family Medicine

## 2016-04-12 ENCOUNTER — Ambulatory Visit: Payer: Medicare Other | Admitting: General Surgery

## 2016-04-14 ENCOUNTER — Other Ambulatory Visit: Payer: Self-pay

## 2016-04-14 ENCOUNTER — Other Ambulatory Visit: Payer: Self-pay | Admitting: Cardiovascular Disease

## 2016-04-14 MED ORDER — HYDROCHLOROTHIAZIDE 25 MG PO TABS: 25.0000 mg | ORAL_TABLET | Freq: Every day | ORAL | Status: DC

## 2016-04-14 NOTE — Telephone Encounter (Signed)
Pt left v/m requesting refill HCTZ to [facility] [address]. Pt said previously filled by Dr Acie Fredrickson but pt has discussed with Dr Deborra Medina about refilling this med. Pt request cb when refilled. Pt last seen 09/03/15.Please advise.

## 2016-04-14 NOTE — Telephone Encounter (Signed)
Pt notified refill done and pt voiced understanding. 

## 2016-04-15 ENCOUNTER — Other Ambulatory Visit: Payer: Self-pay

## 2016-04-15 MED ORDER — ATORVASTATIN CALCIUM 40 MG PO TABS: 40.0000 mg | ORAL_TABLET | Freq: Every day | ORAL | Status: DC

## 2016-04-15 NOTE — Telephone Encounter (Signed)
Pt left v/m requesting refill for atorvastatin to [facility] [address]. Pt is going out of town this afternoon and is out of med. Pt request cb when refilled. Pt last seen 07/30/15 when atorvastatin discussed. Pt had lipid labs on 02/24/16. The 02/29/16 wellness exam was Oked to be cancelled by provider per appt notes. Last refilled # 90 on 11/27/15.Please advise.

## 2016-04-15 NOTE — Telephone Encounter (Signed)
Medication sent electronically 

## 2016-04-18 ENCOUNTER — Ambulatory Visit
Admission: RE | Admit: 2016-04-18 | Discharge: 2016-04-18 | Disposition: A | Discharge status: Home / Self Care | Payer: Medicare Other | Source: Ambulatory Visit | Attending: General Surgery | Admitting: General Surgery

## 2016-04-18 DIAGNOSIS — Z853 Personal history of malignant neoplasm of breast: Secondary | ICD-10-CM

## 2016-04-18 HISTORY — DX: Personal history of antineoplastic chemotherapy: Z92.21

## 2016-04-18 HISTORY — DX: Malignant neoplasm of unspecified site of unspecified female breast: C50.919

## 2016-04-18 HISTORY — DX: Reserved for concepts with insufficient information to code with codable children: IMO0002

## 2016-04-26 ENCOUNTER — Encounter: Payer: Self-pay | Admitting: General Surgery

## 2016-04-26 ENCOUNTER — Ambulatory Visit (INDEPENDENT_AMBULATORY_CARE_PROVIDER_SITE_OTHER): Payer: Medicare Other | Admitting: General Surgery

## 2016-04-26 VITALS — BP 136/78 | HR 74 | Resp 12 | Ht 59.0 in | Wt 152.0 lb

## 2016-04-26 DIAGNOSIS — Z853 Personal history of malignant neoplasm of breast: Secondary | ICD-10-CM | POA: Diagnosis not present

## 2016-04-26 NOTE — Progress Notes (Signed)
Patient ID: Kathleen Jones, female   DOB: 1946/06/28, 70 y.o.   MRN: [id number]  Chief Complaint  Patient presents with  . Follow-up    mammogram    HPI Kathleen Jones is a 70 y.o. female who presents for a breast evaluation. The most recent mammogram was done on 04/18/16.  Patient does perform regular self breast checks and gets regular mammograms done.  No new breast complaints. I personally reviewed the patient's history. HPI  Past Medical History  Diagnosis Date  . High cholesterol   . Personal history of malignant neoplasm of breast 2010  . Arthritis   . Hypertension   . Cancer ([place]) 2010     T1a, N0; ER neg/ PR neg, Her 2 neu 3+ wide local excision and sentinel node biopsy her for left breast cancer in April 03, 2009. 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 Sentinel nodes were negative. Adjuvant chemo and RT.  Marland Kitchen Radiation 2010    BREAST CA  . History of chemotherapy 2010    BREAST CA  . Breast cancer ([place]) 2010    LT LUMPECTOMY    Past Surgical History  Procedure Laterality Date  . Breast lumpectomy Left 2010     wide local excision and sentinel node biopsy her for left breast cancer in April 03, 2009. 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 Sentinel nodes were negative  . Colonoscopy  2011    Dr. Vira Agar  . Tonsillectomy    . Abdominal hysterectomy  1983  . Abdominoplasty  1999  . Power port placement   2010  . Mm breast stereo bx*l*r/s Left 2010  . Incontinence surgery N/A September 17, 2015  . Breast cyst aspiration Bilateral     Family History  Problem Relation Age of Onset  . Breast cancer Paternal Aunt   . Ovarian cancer    . Colon cancer    . Hypertension Mother   . Hyperlipidemia Mother   . Hypertension Father     Social History Social History  Substance Use Topics  . Smoking status: Former Smoker -- 1.50  packs/day for 15 years    Types: Cigarettes    Quit date: 01/23/1982  . Smokeless tobacco: None     Comment: quit 1983  . Alcohol Use: Yes     Comment: occassionally    Allergies  Allergen Reactions  . Amlodipine     Leg swelling    Current Outpatient Prescriptions  Medication Sig Dispense Refill  . ALPHA LIPOIC ACID PO Take 1 capsule by mouth daily.    Marland Kitchen aspirin 81 MG tablet Take 81 mg by mouth daily.     . benzonatate (TESSALON) 200 MG capsule Take 1 capsule (200 mg total) by mouth 2 (two) times daily as needed for cough. 20 capsule 0  . CINNAMON PO Take 1 capsule by mouth daily.    . diclofenac (VOLTAREN) 75 MG EC tablet TAKE ONE TABLET BY MOUTH TWICE DAILY 180 tablet 1  . docusate sodium (COLACE) 100 MG capsule Take 100 mg by mouth as needed for constipation.    . hydrALAZINE (APRESOLINE) 10 MG tablet TAKE ONE TABLET BY MOUTH THREE TIMES DAILY 90 tablet 1  . hydrALAZINE (APRESOLINE) 25 MG tablet TAKE ONE TABLET BY MOUTH THREE TIMES DAILY 90 tablet 0  . hydrochlorothiazide (HYDRODIURIL) 25 MG tablet Take 1 tablet (25 mg total) by mouth daily. 90 tablet 1  .  metoprolol succinate (TOPROL-XL) 50 MG 24 hr tablet TAKE ONE TABLET BY MOUTH ONCE DAILY 90 tablet 1  . nystatin cream (MYCOSTATIN) Apply 1 application topically 2 (two) times daily. (Patient taking differently: Apply 1 application topically as needed. ) 30 g 1  . solifenacin (VESICARE) 10 MG tablet Take 1 tablet (10 mg total) by mouth daily. 30 tablet 3  . amoxicillin (AMOXIL) 500 MG capsule Take 1 capsule (500 mg total) by mouth 3 (three) times daily. (Patient not taking: Reported on 04/26/2016) 30 capsule 0  . atorvastatin (LIPITOR) 40 MG tablet Take 1 tablet (40 mg total) by mouth daily. (Patient not taking: Reported on 04/26/2016) 90 tablet 0   No current facility-administered medications for this visit.    Review of Systems Review of Systems  Constitutional: Negative.   Respiratory: Negative.   Cardiovascular:  Negative.     Blood pressure 136/78, pulse 74, resp. rate 12, height 4' 11"  (1.499 m), weight 152 lb (68.947 kg).  Physical Exam Physical Exam  Constitutional: She is oriented to person, place, and time. She appears well-developed and well-nourished.  HENT:  Mouth/Throat: Oropharynx is clear and moist.  Eyes: Conjunctivae are normal. No scleral icterus.  Neck: Neck supple.  Cardiovascular: Normal rate, regular rhythm and normal heart sounds.   Pulmonary/Chest: Effort normal and breath sounds normal. Right breast exhibits no inverted nipple, no mass, no nipple discharge, no skin change and no tenderness. Left breast exhibits no inverted nipple, no mass, no nipple discharge, no skin change and no tenderness.    Well healed incision 10 to 2  o'clock left breast with mild pigment loss.  Abdominal: Soft. There is no tenderness.  Lymphadenopathy:    She has no cervical adenopathy.    She has no axillary adenopathy.  Neurological: She is alert and oriented to person, place, and time.  Skin: Skin is warm and dry.  Psychiatric: Her behavior is normal.    Data Reviewed Emma grams completed earlier this month were reviewed. No interval change.  Assessment    No evidence of recurrent cancer. Benign breast exam.    Plan           Her colonoscopy is due. The patient has been asked to return to the office in one year with a bilateral screening mammogram.    PCP:  Arnette Norris This information has been scribed by Gaspar Cola CMA.   Robert Bellow 04/27/2016, 8:30 PM

## 2016-04-26 NOTE — Patient Instructions (Addendum)
Continue self breast exams. Call office for any new breast issues or concerns. The patient is aware to call back for any questions or concerns. The patient has been asked to return to the office in one year with a bilateral screening mammogram

## 2016-05-19 ENCOUNTER — Other Ambulatory Visit: Payer: Self-pay | Admitting: Family Medicine

## 2016-07-15 ENCOUNTER — Other Ambulatory Visit: Payer: Self-pay | Admitting: Cardiovascular Disease

## 2016-07-19 ENCOUNTER — Ambulatory Visit (INDEPENDENT_AMBULATORY_CARE_PROVIDER_SITE_OTHER): Payer: Medicare Other | Admitting: Family Medicine

## 2016-07-19 ENCOUNTER — Encounter: Payer: Self-pay | Admitting: Family Medicine

## 2016-07-19 VITALS — BP 128/76 | HR 69 | Temp 97.9°F | Wt 150.5 lb

## 2016-07-19 DIAGNOSIS — I10 Essential (primary) hypertension: Secondary | ICD-10-CM

## 2016-07-19 MED ORDER — HYDRALAZINE HCL 25 MG PO TABS: 25.0000 mg | ORAL_TABLET | Freq: Three times a day (TID) | ORAL | 3 refills | Status: DC

## 2016-07-19 NOTE — Progress Notes (Signed)
Subjective:   Patient ID: Kathleen Jones, female    DOB: 10-15-1946, 70 y.o.   MRN: [id number]  Kathleen Jones is a pleasant 70 y.o. year old female who presents to clinic today with Follow-up  on 07/19/2016  HPI:  HTN-  Last year, d/c'd losartan due to cough.  Could not tolerate amlodipine so added Hydralazine 10 mg three times daily to HCTZ and metoprolol at previous doses. Dr. Darnell Level changed hydralazine to 25 mg three times daily but she is taking it twice daily because of hypotension.    BP has been increasing at home- ranging 150s- 180s/70s-90s.  Denies HA, blurred vision, CP, SOB or LE edema. Lab Results  Component Value Date   CREATININE 0.88 02/24/2016   Current Outpatient Prescriptions on File Prior to Visit  Medication Sig Dispense Refill  . ALPHA LIPOIC ACID PO Take 1 capsule by mouth daily.    Marland Kitchen aspirin 81 MG tablet Take 81 mg by mouth daily.     Marland Kitchen atorvastatin (LIPITOR) 40 MG tablet Take 1 tablet (40 mg total) by mouth daily. 90 tablet 0  . CINNAMON PO Take 1 capsule by mouth daily.    . diclofenac (VOLTAREN) 75 MG EC tablet TAKE ONE TABLET BY MOUTH TWICE DAILY 180 tablet 1  . docusate sodium (COLACE) 100 MG capsule Take 100 mg by mouth as needed for constipation.    . hydrALAZINE (APRESOLINE) 25 MG tablet TAKE ONE TABLET BY MOUTH THREE TIMES DAILY 90 tablet 0  . hydrochlorothiazide (HYDRODIURIL) 25 MG tablet Take 1 tablet (25 mg total) by mouth daily. 90 tablet 1  . metoprolol succinate (TOPROL-XL) 50 MG 24 hr tablet TAKE ONE TABLET BY MOUTH ONCE DAILY 90 tablet 1  . nystatin cream (MYCOSTATIN) Apply 1 application topically 2 (two) times daily. (Patient taking differently: Apply 1 application topically as needed. ) 30 g 1   No current facility-administered medications on file prior to visit.     Allergies  Allergen Reactions  . Amlodipine     Leg swelling    Past Medical History:  Diagnosis Date  . Arthritis   . Breast cancer ([place]) 2010   LT LUMPECTOMY  .  Cancer ([place]) 2010    T1a, N0; ER neg/ PR neg, Her 2 neu 3+ wide local excision and sentinel node biopsy her for left breast cancer in April 03, 2009. 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 Sentinel nodes were negative. Adjuvant chemo and RT.  Marland Kitchen High cholesterol   . History of chemotherapy 2010   BREAST CA  . Hypertension   . Personal history of malignant neoplasm of breast 2010  . Radiation 2010   BREAST CA    Past Surgical History:  Procedure Laterality Date  . ABDOMINAL HYSTERECTOMY  1983  . ABDOMINOPLASTY  1999  . BREAST CYST ASPIRATION Bilateral   . BREAST LUMPECTOMY Left 2010    wide local excision and sentinel node biopsy her for left breast cancer in April 03, 2009. 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 Sentinel nodes were negative  . COLONOSCOPY  2011   Dr. Vira Agar  . INCONTINENCE SURGERY N/A September 17, 2015  . MM BREAST STEREO BX*L*R/S Left 2010  . Power Port placement   2010  . TONSILLECTOMY      Family History  Problem Relation Age of Onset  . Breast cancer Paternal Aunt   . Ovarian cancer    .  Colon cancer    . Hypertension Mother   . Hyperlipidemia Mother   . Hypertension Father     Social History   Social History  . Marital status: Married    Spouse name: N/A  . Number of children: N/A  . Years of education: N/A   Occupational History  . Not on file.   Social History Main Topics  . Smoking status: Former Smoker    Packs/day: 1.50    Years: 15.00    Types: Cigarettes    Quit date: 01/23/1982  . Smokeless tobacco: Not on file     Comment: quit 1983  . Alcohol use Yes     Comment: occassionally  . Drug use: No  . Sexual activity: Yes   Other Topics Concern  . Not on file   Social History Narrative   Has a Living Will.   Desires CPR.  Would not desire prolonged life support.            The  PMH, PSH, Social History, Family History, Medications, and allergies have been reviewed in [facility], and have been updated if relevant.   Review of Systems  Constitutional: Negative.   HENT: Negative.   Respiratory: Negative.   Cardiovascular: Negative.   Gastrointestinal: Negative.   Musculoskeletal: Negative.   Neurological: Negative.   Psychiatric/Behavioral: Negative.   All other systems reviewed and are negative.      Objective:    BP 128/76   Pulse 69   Temp 97.9 F (36.6 C) (Oral)   Wt 150 lb 8 oz (68.3 kg)   SpO2 98%   BMI 30.40 kg/m     Physical Exam  Constitutional: She is oriented to person, place, and time. She appears well-developed and well-nourished. No distress.  HENT:  Head: Normocephalic.  Eyes: Conjunctivae are normal.  Cardiovascular: Normal rate and regular rhythm.   Pulmonary/Chest: Breath sounds normal.  Musculoskeletal: Normal range of motion. She exhibits no edema.  Neurological: She is alert and oriented to person, place, and time. No cranial nerve deficit.  Skin: Skin is warm and dry. She is not diaphoretic.  Psychiatric: She has a normal mood and affect. Her behavior is normal. Judgment and thought content normal.  Nursing note and vitals reviewed.         Assessment & Plan:   Essential hypertension No Follow-up on file.

## 2016-07-19 NOTE — Assessment & Plan Note (Signed)
BP well controlled here. Reassurance provided. Advised to get her home cuff recalibrated or at least to bring it in here for comparison. The patient indicates understanding of these issues and agrees with the plan.

## 2016-07-19 NOTE — Addendum Note (Signed)
Addended by: Lucille Passy on: 07/19/2016 10:41 AM   Modules accepted: Orders

## 2016-07-19 NOTE — Progress Notes (Signed)
Pre visit review using our clinic review tool, if applicable. No additional management support is needed unless otherwise documented below in the visit note. 

## 2016-08-04 ENCOUNTER — Encounter: Payer: Self-pay | Admitting: *Deleted

## 2016-08-05 ENCOUNTER — Ambulatory Visit
Admission: RE | Admit: 2016-08-05 | Discharge: 2016-08-05 | Disposition: A | Discharge status: Home / Self Care | Payer: Medicare Other | Source: Ambulatory Visit | Attending: Unknown Physician Specialty | Admitting: Unknown Physician Specialty

## 2016-08-05 ENCOUNTER — Encounter: Payer: Self-pay | Admitting: *Deleted

## 2016-08-05 ENCOUNTER — Ambulatory Visit: Payer: Medicare Other | Admitting: Certified Registered Nurse Anesthetist

## 2016-08-05 ENCOUNTER — Encounter
Admission: RE | Disposition: A | Discharge status: Home / Self Care | Payer: Self-pay | Source: Ambulatory Visit | Attending: Unknown Physician Specialty

## 2016-08-05 DIAGNOSIS — Z87891 Personal history of nicotine dependence: Secondary | ICD-10-CM | POA: Insufficient documentation

## 2016-08-05 DIAGNOSIS — K5730 Diverticulosis of large intestine without perforation or abscess without bleeding: Secondary | ICD-10-CM | POA: Diagnosis not present

## 2016-08-05 DIAGNOSIS — K641 Second degree hemorrhoids: Secondary | ICD-10-CM | POA: Diagnosis not present

## 2016-08-05 DIAGNOSIS — Z1211 Encounter for screening for malignant neoplasm of colon: Secondary | ICD-10-CM | POA: Diagnosis present

## 2016-08-05 DIAGNOSIS — E7800 Pure hypercholesterolemia, unspecified: Secondary | ICD-10-CM | POA: Diagnosis not present

## 2016-08-05 DIAGNOSIS — I10 Essential (primary) hypertension: Secondary | ICD-10-CM | POA: Diagnosis not present

## 2016-08-05 DIAGNOSIS — M1990 Unspecified osteoarthritis, unspecified site: Secondary | ICD-10-CM | POA: Diagnosis not present

## 2016-08-05 DIAGNOSIS — Z86010 Personal history of colonic polyps: Secondary | ICD-10-CM | POA: Diagnosis not present

## 2016-08-05 DIAGNOSIS — Z79899 Other long term (current) drug therapy: Secondary | ICD-10-CM | POA: Diagnosis not present

## 2016-08-05 DIAGNOSIS — Z7982 Long term (current) use of aspirin: Secondary | ICD-10-CM | POA: Diagnosis not present

## 2016-08-05 DIAGNOSIS — K644 Residual hemorrhoidal skin tags: Secondary | ICD-10-CM | POA: Insufficient documentation

## 2016-08-05 DIAGNOSIS — Z853 Personal history of malignant neoplasm of breast: Secondary | ICD-10-CM | POA: Diagnosis not present

## 2016-08-05 DIAGNOSIS — Z9221 Personal history of antineoplastic chemotherapy: Secondary | ICD-10-CM | POA: Diagnosis not present

## 2016-08-05 DIAGNOSIS — Z923 Personal history of irradiation: Secondary | ICD-10-CM | POA: Insufficient documentation

## 2016-08-05 HISTORY — PX: COLONOSCOPY WITH PROPOFOL: SHX5780

## 2016-08-05 SURGERY — COLONOSCOPY WITH PROPOFOL
Anesthesia: General

## 2016-08-05 MED ORDER — PROPOFOL 10 MG/ML IV BOLUS
INTRAVENOUS | Status: DC | PRN
  Administered 2016-08-05: 20 mg via INTRAVENOUS
  Administered 2016-08-05: 10 mg via INTRAVENOUS
  Administered 2016-08-05 (×4): 20 mg via INTRAVENOUS
  Administered 2016-08-05: 30 mg via INTRAVENOUS

## 2016-08-05 MED ORDER — PROPOFOL 500 MG/50ML IV EMUL
INTRAVENOUS | Status: DC | PRN
  Administered 2016-08-05: 100 ug/kg/min via INTRAVENOUS

## 2016-08-05 MED ORDER — SODIUM CHLORIDE 0.9 % IV SOLN: INTRAVENOUS | Status: DC

## 2016-08-05 MED ORDER — LIDOCAINE HCL (CARDIAC) 20 MG/ML IV SOLN
INTRAVENOUS | Status: DC | PRN
  Administered 2016-08-05: 50 mg via INTRAVENOUS

## 2016-08-05 MED ORDER — SODIUM CHLORIDE 0.9 % IV SOLN
INTRAVENOUS | Status: DC
  Administered 2016-08-05: 1000 mL via INTRAVENOUS

## 2016-08-05 NOTE — Anesthesia Preprocedure Evaluation (Signed)
Anesthesia Evaluation  Patient identified by MRN, date of birth, ID band Patient awake    Reviewed: Allergy & Precautions, H&P , NPO status , Patient's Chart, lab work & pertinent test results, reviewed documented beta blocker date and time   History of Anesthesia Complications Negative for: history of anesthetic complications  Airway Mallampati: II  TM Distance: >3 FB Neck ROM: full    Dental no notable dental hx. (+) Teeth Intact   Pulmonary neg pulmonary ROS, former smoker,    Pulmonary exam normal breath sounds clear to auscultation       Cardiovascular Exercise Tolerance: Good hypertension, (-) angina(-) CAD, (-) Past MI, (-) Cardiac Stents and (-) CABG Normal cardiovascular exam(-) dysrhythmias (-) Valvular Problems/Murmurs Rhythm:regular Rate:Normal     Neuro/Psych negative neurological ROS  negative psych ROS   GI/Hepatic negative GI ROS, Neg liver ROS,   Endo/Other  negative endocrine ROS  Renal/GU negative Renal ROS  negative genitourinary   Musculoskeletal   Abdominal   Peds  Hematology negative hematology ROS (+)   Anesthesia Other Findings Past Medical History: No date: Arthritis 2010: Breast cancer ([place])     Comment: LT LUMPECTOMY 2010: Cancer ([place])     Comment:  T1a, N0; ER neg/ PR neg, Her 2 neu 3+ wide               local excision and sentinel node biopsy her for              left breast cancer in April 03, 2009. This was               ER/PR negative, HER-2/neu overexpressing               infiltrating lobular carcinoma with lobular               carcinoma in situ. Sentinel nodes were               negative. Adjuvant chemo and RT. No date: High cholesterol 2010: History of chemotherapy     Comment: BREAST CA No date: Hypertension 2010: Personal history of malignant neoplasm of brea* 2010: Radiation     Comment: BREAST CA   Reproductive/Obstetrics negative OB ROS                              Anesthesia Physical Anesthesia Plan  ASA: II  Anesthesia Plan: General   Post-op Pain Management:    Induction:   Airway Management Planned:   Additional Equipment:   Intra-op Plan:   Post-operative Plan:   Informed Consent: I have reviewed the patients History and Physical, chart, labs and discussed the procedure including the risks, benefits and alternatives for the proposed anesthesia with the patient or authorized representative who has indicated his/her understanding and acceptance.   Dental Advisory Given  Plan Discussed with: Anesthesiologist, CRNA and Surgeon  Anesthesia Plan Comments:         Anesthesia Quick Evaluation

## 2016-08-05 NOTE — H&P (Signed)
Primary Care Physician:  Arnette Norris, MD Primary Gastroenterologist:  Dr. Vira Agar  Pre-Procedure History & Physical: HPI:  Kathleen Jones is a 70 y.o. female is here for an colonoscopy.   Past Medical History:  Diagnosis Date  . Arthritis   . Breast cancer ([place]) 2010   LT LUMPECTOMY  . Cancer ([place]) 2010    T1a, N0; ER neg/ PR neg, Her 2 neu 3+ wide local excision and sentinel node biopsy her for left breast cancer in April 03, 2009. 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 Sentinel nodes were negative. Adjuvant chemo and RT.  Marland Kitchen High cholesterol   . History of chemotherapy 2010   BREAST CA  . Hypertension   . Personal history of malignant neoplasm of breast 2010  . Radiation 2010   BREAST CA    Past Surgical History:  Procedure Laterality Date  . ABDOMINAL HYSTERECTOMY  1983  . ABDOMINOPLASTY  1999  . BREAST CYST ASPIRATION Bilateral   . BREAST LUMPECTOMY Left 2010    wide local excision and sentinel node biopsy her for left breast cancer in April 03, 2009. 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 Sentinel nodes were negative  . COLONOSCOPY  2011   Dr. Vira Agar  . INCONTINENCE SURGERY N/A September 17, 2015  . MM BREAST STEREO BX*L*R/S Left 2010  . Power Port placement   2010  . TONSILLECTOMY      Prior to Admission medications   Medication Sig Start Date End Date Taking? Authorizing Provider  hydrALAZINE (APRESOLINE) 25 MG tablet Take 1 tablet (25 mg total) by mouth 3 (three) times daily. 07/19/16  Yes Lucille Passy, MD  hydrochlorothiazide (HYDRODIURIL) 25 MG tablet Take 1 tablet (25 mg total) by mouth daily. 04/14/16  Yes Lucille Passy, MD  ALPHA LIPOIC ACID PO Take 1 capsule by mouth daily.    Historical Provider, MD  aspirin 81 MG tablet Take 81 mg by mouth daily.     Historical Provider, MD  atorvastatin (LIPITOR) 40 MG tablet Take 1  tablet (40 mg total) by mouth daily. 04/15/16   Jearld Fenton, NP  CINNAMON PO Take 1 capsule by mouth daily.    Historical Provider, MD  diclofenac (VOLTAREN) 75 MG EC tablet TAKE ONE TABLET BY MOUTH TWICE DAILY 07/30/15   Lucille Passy, MD  docusate sodium (COLACE) 100 MG capsule Take 100 mg by mouth as needed for constipation.    Historical Provider, MD  nystatin cream (MYCOSTATIN) Apply 1 application topically 2 (two) times daily. Patient taking differently: Apply 1 application topically as needed.  02/17/14   Lucille Passy, MD    Allergies as of 06/02/2016 - Review Complete 04/26/2016  Allergen Reaction Noted  . Amlodipine  07/30/2015    Family History  Problem Relation Age of Onset  . Breast cancer Paternal Aunt   . Hypertension Mother   . Hyperlipidemia Mother   . Hypertension Father   . Ovarian cancer    . Colon cancer      Social History   Social History  . Marital status: Married    Spouse name: N/A  . Number of children: N/A  . Years of education: N/A   Occupational History  . Not on file.   Social History Main Topics  . Smoking status: Former Smoker    Packs/day: 1.50    Years: 15.00    Types: Cigarettes  Quit date: 01/23/1982  . Smokeless tobacco: Former Systems developer     Comment: quit 1983  . Alcohol use Yes     Comment: occassionally  . Drug use: No  . Sexual activity: Yes   Other Topics Concern  . Not on file   Social History Narrative   Has a Living Will.   Desires CPR.  Would not desire prolonged life support.             Review of Systems: See HPI, otherwise negative ROS  Physical Exam: BP (!) 174/86   Pulse 94   Temp 98 F (36.7 C) (Tympanic)   Resp 16   Ht 4' 10.75" (1.492 m)   Wt 65.8 kg (145 lb)   SpO2 95%   BMI 29.54 kg/m  General:   Alert,  pleasant and cooperative in NAD Head:  Normocephalic and atraumatic. Neck:  Supple; no masses or thyromegaly. Lungs:  Clear throughout to auscultation.    Heart:  Regular rate and  rhythm. Abdomen:  Soft, nontender and nondistended. Normal bowel sounds, without guarding, and without rebound.   Neurologic:  Alert and  oriented x4;  grossly normal neurologically.  Impression/Plan: [name] is here for an colonoscopy to be performed for [facility] colon polyps  Risks, benefits, limitations, and alternatives regarding  colonoscopy have been reviewed with the patient.  Questions have been answered.  All parties agreeable.   Gaylyn Cheers, MD  08/05/2016, 9:02 AM

## 2016-08-05 NOTE — Anesthesia Postprocedure Evaluation (Signed)
Anesthesia Post Note  Patient: Kathleen Jones  Procedure(s) Performed: Procedure(s) (LRB): COLONOSCOPY WITH PROPOFOL (N/A)  Patient location during evaluation: Endoscopy Anesthesia Type: General Level of consciousness: awake and alert Pain management: pain level controlled Vital Signs Assessment: post-procedure vital signs reviewed and stable Respiratory status: spontaneous breathing, nonlabored ventilation, respiratory function stable and patient connected to nasal cannula oxygen Cardiovascular status: blood pressure returned to baseline and stable Postop Assessment: no signs of nausea or vomiting Anesthetic complications: no    Last Vitals:  Vitals:   08/05/16 1014 08/05/16 1020  BP: (!) 153/63 (!) 176/80  Pulse: 72   Resp: 20   Temp: (!) 36 C     Last Pain:  Vitals:   08/05/16 1014  TempSrc: Tympanic                 Martha Clan

## 2016-08-05 NOTE — Anesthesia Procedure Notes (Signed)
Date/Time: 08/05/2016 9:15 AM Performed by: Johnna Acosta Pre-anesthesia Checklist: Patient identified, Emergency Drugs available, Suction available, Patient being monitored and Timeout performed Patient Re-evaluated:Patient Re-evaluated prior to inductionOxygen Delivery Method: Nasal cannula

## 2016-08-05 NOTE — Op Note (Signed)
[facility] Gastroenterology Patient Name: Kathleen Jones Procedure Date: 08/05/2016 9:06 AM MRN: [id number] Account #: [id number] Date of Birth: 07/07/1946 Admit Type: Outpatient Age: 70 Room: [facility] ENDO ROOM 4 Gender: Female Note Status: Finalized Procedure:            Colonoscopy Indications:          High risk colon cancer surveillance: Personal history                        of colonic polyps Providers:            Manya Silvas, MD Referring MD:         Marciano Sequin. Deborra Medina (Referring MD) Medicines:            Propofol per Anesthesia Complications:        No immediate complications. Procedure:            Pre-Anesthesia Assessment:                       - After reviewing the risks and benefits, the patient                        was deemed in satisfactory condition to undergo the                        procedure.                       After obtaining informed consent, the colonoscope was                        passed under direct vision. Throughout the procedure,                        the patient's blood pressure, pulse, and oxygen                        saturations were monitored continuously. The                        Colonoscope was introduced through the anus and                        advanced to the the ileocecal valve. The colonoscopy                        was performed with difficulty due to restricted                        mobility of the colon, significant looping and a                        tortuous colon. Successful completion of the procedure                        was aided by applying abdominal pressure. The patient                        tolerated the procedure well. The quality of the bowel  preparation was excellent. Findings:      A few small-mouthed diverticula were found in the sigmoid colon.      Internal hemorrhoids were found during endoscopy. The hemorrhoids were       small and Grade I (internal hemorrhoids that  do not prolapse).      External hemorrhoids were found during perianal exam. The hemorrhoids       were small and Grade II (internal hemorrhoids that prolapse but reduce       spontaneously). Impression:           - Diverticulosis in the sigmoid colon.                       - Internal hemorrhoids.                       - External hemorrhoids.                       - No specimens collected. Recommendation:       - The findings and recommendations were discussed with                        the patient. Due to very difficult colonoscopy from                        previous abd surgery I do not recommend a repeat                        routine colonoscopy. Manya Silvas, MD 08/05/2016 10:16:33 AM This report has been signed electronically. Number of Addenda: 0 Note Initiated On: 08/05/2016 9:06 AM Scope Withdrawal Time: 0 hours 11 minutes 57 seconds  Total Procedure Duration: 0 hours 48 minutes 53 seconds       [facility]

## 2016-08-05 NOTE — Transfer of Care (Signed)
Immediate Anesthesia Transfer of Care Note  Patient: Kathleen Jones  Procedure(s) Performed: Procedure(s): COLONOSCOPY WITH PROPOFOL (N/A)  Patient Location: PACU  Anesthesia Type:General  Level of Consciousness: sedated  Airway & Oxygen Therapy: Patient Spontanous Breathing and Patient connected to nasal cannula oxygen  Post-op Assessment: Report given to RN and Post -op Vital signs reviewed and stable  Post vital signs: Reviewed and stable  Last Vitals:  Vitals:   08/05/16 1010 08/05/16 1014  BP: (!) 153/63 (!) 153/63  Pulse: 73 72  Resp: 18 20  Temp: (!) 36 C (!) 36 C    Last Pain:  Vitals:   08/05/16 1014  TempSrc: Tympanic         Complications: No apparent anesthesia complications

## 2016-08-08 ENCOUNTER — Encounter: Payer: Self-pay | Admitting: Unknown Physician Specialty

## 2016-08-18 ENCOUNTER — Other Ambulatory Visit: Payer: Self-pay | Admitting: Family Medicine

## 2016-08-19 NOTE — Telephone Encounter (Signed)
Last filled on 07/30/15 #180 +1, last OV 07/19/2016. Ok to refill?

## 2016-08-23 ENCOUNTER — Other Ambulatory Visit: Payer: Self-pay | Admitting: Family Medicine

## 2016-10-10 ENCOUNTER — Other Ambulatory Visit: Payer: Self-pay | Admitting: Family Medicine

## 2016-10-13 ENCOUNTER — Telehealth: Payer: Self-pay | Admitting: *Deleted

## 2016-10-13 NOTE — Telephone Encounter (Signed)
Pt left voicemail at Triage, she wants to know why lipitor was declined, pt said she had her CPE done in March and she will call and make sure that's her next CPE is schedule before then but she is requesting med to be filled asap

## 2016-10-14 MED ORDER — ATORVASTATIN CALCIUM 40 MG PO TABS: 40.0000 mg | ORAL_TABLET | Freq: Every day | ORAL | 1 refills | Status: DC

## 2016-10-14 NOTE — Telephone Encounter (Signed)
Spoke to pt and advised Rx was denied due to her needing repeat labs. Is she able to have a lab appt only, or is she needing to be seen?

## 2016-10-14 NOTE — Telephone Encounter (Signed)
pts last labs were abnormal and had deteriorated from previous labs. Rx called in to requested pharmacy

## 2016-10-14 NOTE — Telephone Encounter (Signed)
She had lipids done in March.  There is no reason why we should be denying her rx.  Ok to refill and just scheduled follow up labs.

## 2017-01-12 ENCOUNTER — Other Ambulatory Visit: Payer: Self-pay | Admitting: Family Medicine

## 2017-02-09 ENCOUNTER — Other Ambulatory Visit: Payer: Self-pay

## 2017-02-09 DIAGNOSIS — Z1231 Encounter for screening mammogram for malignant neoplasm of breast: Secondary | ICD-10-CM

## 2017-04-18 ENCOUNTER — Encounter: Payer: Self-pay | Admitting: *Deleted

## 2017-04-19 ENCOUNTER — Other Ambulatory Visit: Payer: Self-pay | Admitting: Family Medicine

## 2017-04-19 ENCOUNTER — Ambulatory Visit
Admission: RE | Admit: 2017-04-19 | Discharge: 2017-04-19 | Disposition: A | Discharge status: Home / Self Care | Payer: Medicare Other | Source: Ambulatory Visit | Attending: General Surgery | Admitting: General Surgery

## 2017-04-19 DIAGNOSIS — Z1231 Encounter for screening mammogram for malignant neoplasm of breast: Secondary | ICD-10-CM | POA: Diagnosis present

## 2017-04-19 DIAGNOSIS — Z01419 Encounter for gynecological examination (general) (routine) without abnormal findings: Secondary | ICD-10-CM

## 2017-04-19 DIAGNOSIS — E785 Hyperlipidemia, unspecified: Secondary | ICD-10-CM

## 2017-04-19 HISTORY — DX: Personal history of irradiation: Z92.3

## 2017-04-19 HISTORY — DX: Personal history of antineoplastic chemotherapy: Z92.21

## 2017-04-24 ENCOUNTER — Other Ambulatory Visit (INDEPENDENT_AMBULATORY_CARE_PROVIDER_SITE_OTHER): Payer: Medicare Other

## 2017-04-24 DIAGNOSIS — Z01419 Encounter for gynecological examination (general) (routine) without abnormal findings: Secondary | ICD-10-CM

## 2017-04-24 LAB — CBC WITH DIFFERENTIAL/PLATELET
BASOS ABS: 0.1 10*3/uL (ref 0.0–0.1)
Basophils Relative: 1 % (ref 0.0–3.0)
EOS PCT: 4.7 % (ref 0.0–5.0)
Eosinophils Absolute: 0.3 10*3/uL (ref 0.0–0.7)
HEMATOCRIT: 39.6 % (ref 36.0–46.0)
Hemoglobin: 13.2 g/dL (ref 12.0–15.0)
LYMPHS PCT: 32.6 % (ref 12.0–46.0)
Lymphs Abs: 2.2 10*3/uL (ref 0.7–4.0)
MCHC: 33.3 g/dL (ref 30.0–36.0)
MCV: 89.5 fl (ref 78.0–100.0)
MONOS PCT: 8.2 % (ref 3.0–12.0)
Monocytes Absolute: 0.6 10*3/uL (ref 0.1–1.0)
NEUTROS ABS: 3.7 10*3/uL (ref 1.4–7.7)
Neutrophils Relative %: 53.5 % (ref 43.0–77.0)
Platelets: 216 10*3/uL (ref 150.0–400.0)
RBC: 4.42 Mil/uL (ref 3.87–5.11)
RDW: 14.1 % (ref 11.5–15.5)
WBC: 6.8 10*3/uL (ref 4.0–10.5)

## 2017-04-24 LAB — COMPREHENSIVE METABOLIC PANEL
ALK PHOS: 127 U/L — AB (ref 39–117)
ALT: 64 U/L — ABNORMAL HIGH (ref 0–35)
AST: 49 U/L — ABNORMAL HIGH (ref 0–37)
Albumin: 4.5 g/dL (ref 3.5–5.2)
BILIRUBIN TOTAL: 0.6 mg/dL (ref 0.2–1.2)
BUN: 21 mg/dL (ref 6–23)
CALCIUM: 10.2 mg/dL (ref 8.4–10.5)
CO2: 33 mEq/L — ABNORMAL HIGH (ref 19–32)
Chloride: 101 mEq/L (ref 96–112)
Creatinine, Ser: 0.78 mg/dL (ref 0.40–1.20)
GFR: 77.19 mL/min (ref >60.00)
Glucose, Bld: 105 mg/dL — ABNORMAL HIGH (ref 70–99)
POTASSIUM: 4.1 meq/L (ref 3.5–5.1)
Sodium: 141 mEq/L (ref 135–145)
TOTAL PROTEIN: 7.3 g/dL (ref 6.0–8.3)

## 2017-04-24 LAB — TSH: TSH: 1.62 u[IU]/mL (ref 0.35–4.50)

## 2017-04-24 LAB — LIPID PANEL
CHOL/HDL RATIO: 3
CHOLESTEROL: 179 mg/dL (ref 0–200)
HDL: 57.8 mg/dL (ref >39.00)
LDL Cholesterol: 89 mg/dL (ref 0–99)
NonHDL: 121.54
TRIGLYCERIDES: 161 mg/dL — AB (ref 0.0–149.0)
VLDL: 32.2 mg/dL (ref 0.0–40.0)

## 2017-04-25 ENCOUNTER — Ambulatory Visit: Payer: Medicare Other | Admitting: General Surgery

## 2017-04-27 ENCOUNTER — Other Ambulatory Visit: Payer: Self-pay

## 2017-04-27 ENCOUNTER — Ambulatory Visit (INDEPENDENT_AMBULATORY_CARE_PROVIDER_SITE_OTHER): Payer: Medicare Other | Admitting: Family Medicine

## 2017-04-27 ENCOUNTER — Encounter: Payer: Self-pay | Admitting: Family Medicine

## 2017-04-27 VITALS — BP 160/90 | HR 89 | Ht <= 58 in | Wt 143.0 lb

## 2017-04-27 DIAGNOSIS — E785 Hyperlipidemia, unspecified: Secondary | ICD-10-CM | POA: Diagnosis not present

## 2017-04-27 DIAGNOSIS — Z0000 Encounter for general adult medical examination without abnormal findings: Secondary | ICD-10-CM | POA: Diagnosis not present

## 2017-04-27 DIAGNOSIS — I10 Essential (primary) hypertension: Secondary | ICD-10-CM

## 2017-04-27 DIAGNOSIS — Z01419 Encounter for gynecological examination (general) (routine) without abnormal findings: Secondary | ICD-10-CM

## 2017-04-27 MED ORDER — HYDROCHLOROTHIAZIDE 25 MG PO TABS: 25.0000 mg | ORAL_TABLET | Freq: Every day | ORAL | 3 refills | Status: DC

## 2017-04-27 MED ORDER — HYDRALAZINE HCL 25 MG PO TABS: 25.0000 mg | ORAL_TABLET | Freq: Three times a day (TID) | ORAL | 3 refills | Status: DC

## 2017-04-27 MED ORDER — ATORVASTATIN CALCIUM 40 MG PO TABS: 40.0000 mg | ORAL_TABLET | Freq: Every day | ORAL | 3 refills | Status: AC

## 2017-04-27 MED ORDER — DICLOFENAC SODIUM 75 MG PO TBEC: 75.0000 mg | DELAYED_RELEASE_TABLET | Freq: Two times a day (BID) | ORAL | 3 refills | Status: AC

## 2017-04-27 NOTE — Progress Notes (Signed)
Pre visit review using our clinic review tool, if applicable. No additional management support is needed unless otherwise documented below in the visit note. 

## 2017-04-27 NOTE — Assessment & Plan Note (Signed)
H/o white coat HTN. Well controlled at home.  No changes made.

## 2017-04-27 NOTE — Assessment & Plan Note (Signed)
Reviewed preventive care protocols, scheduled due services, and updated immunizations Discussed nutrition, exercise, diet, and healthy lifestyle.  

## 2017-04-27 NOTE — Assessment & Plan Note (Signed)
The patients weight, height, BMI and visual acuity have been recorded in the chart.  Cognitive function assessed.   I have made referrals, counseling and provided education to the patient based review of the above and I have provided the pt with a written personalized care plan for preventive services.  

## 2017-04-27 NOTE — Progress Notes (Signed)
Subjective:   Patient ID: Kathleen Jones, female    DOB: 1946/10/24, 71 y.o.   MRN: [id number]  Kathleen Jones is a pleasant 71 y.o. year old female who presents to clinic today with Medicare Wellness  and CPX on 04/27/2017  HPI:  I have personally reviewed the Medicare Annual Wellness questionnaire and have noted 1. The patient's medical and social history 2. Their use of alcohol, tobacco or illicit drugs 3. Their current medications and supplements 4. The patient's functional ability including ADL's, fall risks, home safety risks and hearing or visual             impairment. 5. Diet and physical activities 6. Evidence for depression or mood disorders  End of life wishes discussed and updated in Social History.  The roster of all physicians providing medical care to patient - is listed in the CareTeams section of the chart.  Colonoscopy 08/05/16 Remote h/o hysterectomy Mammogram 04/19/17 Pneumovax 12/20/10 Prevnar 13 02/26/15 Zoster 12/20/10  Has appointment to see Dermatologist next week.  HTN-  Two years ago, d/c'd losartan due to cough. Could not tolerate amlodipine so added Hydralazine 10 mg three times daily to HCTZ and metoprolol at previous doses. Dr. Darnell Level changed hydralazine to 25 mg three times daily but she is taking it twice daily because of hypotension.    BP has been increasing at home- ranging 125- 130s/80s/90s.  Denies HA, blurred vision, CP, SOB or LE edema.  Lab Results  Component Value Date   CREATININE 0.78 04/24/2017   Lab Results  Component Value Date   CHOL 179 04/24/2017   HDL 57.80 04/24/2017   LDLCALC 89 04/24/2017   LDLDIRECT 91.0 02/24/2016   TRIG 161.0 (H) 04/24/2017   CHOLHDL 3 04/24/2017   Lab Results  Component Value Date   NA 141 04/24/2017   K 4.1 04/24/2017   CL 101 04/24/2017   CO2 33 (H) 04/24/2017   Lab Results  Component Value Date   ALT 64 (H) 04/24/2017   AST 49 (H) 04/24/2017   ALKPHOS 127 (H) 04/24/2017   BILITOT  0.6 04/24/2017   Lab Results  Component Value Date   TSH 1.62 04/24/2017   Lab Results  Component Value Date   WBC 6.8 04/24/2017   HGB 13.2 04/24/2017   HCT 39.6 04/24/2017   MCV 89.5 04/24/2017   PLT 216.0 04/24/2017    Current Outpatient Prescriptions on File Prior to Visit  Medication Sig Dispense Refill  . ALPHA LIPOIC ACID PO Take 1 capsule by mouth daily.    Marland Kitchen aspirin 81 MG tablet Take 81 mg by mouth daily.     Marland Kitchen atorvastatin (LIPITOR) 40 MG tablet Take 1 tablet (40 mg total) by mouth daily. 90 tablet 1  . CINNAMON PO Take 1 capsule by mouth daily.    . diclofenac (VOLTAREN) 75 MG EC tablet TAKE ONE TABLET BY MOUTH TWICE DAILY 180 tablet 3  . docusate sodium (COLACE) 100 MG capsule Take 100 mg by mouth as needed for constipation.    . hydrALAZINE (APRESOLINE) 25 MG tablet TAKE ONE TABLET BY MOUTH THREE TIMES DAILY 270 tablet 0  . hydrochlorothiazide (HYDRODIURIL) 25 MG tablet TAKE ONE TABLET BY MOUTH DAILY. 90 tablet 2  . nystatin cream (MYCOSTATIN) Apply 1 application topically 2 (two) times daily. (Patient taking differently: Apply 1 application topically as needed. ) 30 g 1   No current facility-administered medications on file prior to visit.     Allergies  Allergen  Reactions  . Amlodipine     Leg swelling    Past Medical History:  Diagnosis Date  . Arthritis   . Breast cancer ([place]) 2010   LT LUMPECTOMY  . Cancer ([place]) 2010    T1a, N0; ER neg/ PR neg, Her 2 neu 3+ wide local excision and sentinel node biopsy her for left breast cancer in April 03, 2009. 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 Sentinel nodes were negative. Adjuvant chemo and RT.  Marland Kitchen High cholesterol   . History of chemotherapy 2010   BREAST CA  . Hypertension   . Personal history of chemotherapy   . Personal history of malignant neoplasm of breast 2010  . Personal history of radiation therapy   . Radiation 2010    BREAST CA    Past Surgical History:  Procedure Laterality Date  . ABDOMINAL HYSTERECTOMY  1983  . ABDOMINOPLASTY  1999  . BREAST BIOPSY Left 2010   +  . BREAST CYST ASPIRATION Bilateral   . BREAST LUMPECTOMY Left 2010    wide local excision and sentinel node biopsy her for left breast cancer in April 03, 2009. 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 Sentinel nodes were negative  . COLONOSCOPY  2011   Dr. Vira Agar  . COLONOSCOPY WITH PROPOFOL N/A 08/05/2016   Procedure: COLONOSCOPY WITH PROPOFOL;  Surgeon: Manya Silvas, MD;  [facility]: [facility] ENDOSCOPY;  Service: Endoscopy;  Laterality: N/A;  . INCONTINENCE SURGERY N/A September 17, 2015  . MM BREAST STEREO BX*L*R/S Left 2010  . Power Port placement   2010  . TONSILLECTOMY      Family History  Problem Relation Age of Onset  . Breast cancer Paternal Aunt   . Hypertension Mother   . Hyperlipidemia Mother   . Hypertension Father   . Ovarian cancer Unknown   . Colon cancer Unknown     Social History   Social History  . Marital status: Married    Spouse name: N/A  . Number of children: N/A  . Years of education: N/A   Occupational History  . Not on file.   Social History Main Topics  . Smoking status: Former Smoker    Packs/day: 1.50    Years: 15.00    Types: Cigarettes    Quit date: 01/23/1982  . Smokeless tobacco: Former Systems developer     Comment: quit 1983  . Alcohol use Yes     Comment: occassionally  . Drug use: No  . Sexual activity: Yes   Other Topics Concern  . Not on file   Social History Narrative   Has a Living Will.   Desires CPR.  Would not desire prolonged life support.            The PMH, PSH, Social History, Family History, Medications, and allergies have been reviewed in [facility], and have been updated if relevant.    Review of Systems  Constitutional: Negative.   Eyes: Negative.   Respiratory: Negative.   Cardiovascular:  Negative.   Gastrointestinal: Negative.   Endocrine: Negative.   Genitourinary: Negative.   Musculoskeletal: Negative.   Allergic/Immunologic: Negative.   Neurological: Negative.   Hematological: Negative.   Psychiatric/Behavioral: Negative.   All other systems reviewed and are negative.      Objective:    BP (!) 160/90   Pulse 89   Ht 4' 9.75" (1.467 m)   Wt 143 lb (64.9 kg)  SpO2 97%   BMI 30.15 kg/m    Physical Exam   General:  Well-developed,well-nourished,in no acute distress; alert,appropriate and cooperative throughout examination Head:  normocephalic and atraumatic.   Eyes:  vision grossly intact, PERRL Ears:  R ear normal and L ear normal externally, TMs clear bilaterally Nose:  no external deformity.   Mouth:  good dentition.   Neck:  No deformities, masses, or tenderness noted. Breasts:  No mass, nodules, thickening, tenderness, bulging, retraction, inflamation, nipple discharge or skin changes noted.   Lungs:  Normal respiratory effort, chest expands symmetrically. Lungs are clear to auscultation, no crackles or wheezes. Heart:  Normal rate and regular rhythm. S1 and S2 normal without gallop, murmur, click, rub or other extra sounds. Abdomen:  Bowel sounds positive,abdomen soft and non-tender without masses, organomegaly or hernias noted. Msk:  No deformity or scoliosis noted of thoracic or lumbar spine.   Extremities:  No clubbing, cyanosis, edema, or deformity noted with normal full range of motion of all joints.   Neurologic:  alert & oriented X3 and gait normal.   Skin:  Intact without suspicious lesions or rashes Cervical Nodes:  No lymphadenopathy noted Axillary Nodes:  No palpable lymphadenopathy Psych:  Cognition and judgment appear intact. Alert and cooperative with normal attention span and concentration. No apparent delusions, illusions, hallucinations       Assessment & Plan:   Well woman exam  Medicare annual wellness visit,  subsequent  Hyperlipidemia, unspecified hyperlipidemia type  Essential hypertension No Follow-up on file.

## 2017-05-03 ENCOUNTER — Encounter: Payer: Self-pay | Admitting: General Surgery

## 2017-05-03 ENCOUNTER — Ambulatory Visit (INDEPENDENT_AMBULATORY_CARE_PROVIDER_SITE_OTHER)
Admission: RE | Admit: 2017-05-03 | Discharge: 2017-05-03 | Disposition: A | Discharge status: Home / Self Care | Payer: Medicare Other | Source: Ambulatory Visit | Attending: Family Medicine | Admitting: Family Medicine

## 2017-05-03 ENCOUNTER — Ambulatory Visit (INDEPENDENT_AMBULATORY_CARE_PROVIDER_SITE_OTHER): Payer: Medicare Other | Admitting: Family Medicine

## 2017-05-03 ENCOUNTER — Ambulatory Visit (INDEPENDENT_AMBULATORY_CARE_PROVIDER_SITE_OTHER): Payer: Medicare Other | Admitting: General Surgery

## 2017-05-03 ENCOUNTER — Encounter: Payer: Self-pay | Admitting: Family Medicine

## 2017-05-03 VITALS — BP 144/88 | HR 84 | Resp 12 | Ht <= 58 in | Wt 143.0 lb

## 2017-05-03 DIAGNOSIS — M79672 Pain in left foot: Secondary | ICD-10-CM | POA: Diagnosis not present

## 2017-05-03 DIAGNOSIS — Z853 Personal history of malignant neoplasm of breast: Secondary | ICD-10-CM | POA: Diagnosis not present

## 2017-05-03 LAB — URIC ACID: URIC ACID, SERUM: 7.3 mg/dL — AB (ref 2.4–7.0)

## 2017-05-03 NOTE — Patient Instructions (Signed)
Great to see you. I will call you with your xray and labs results.

## 2017-05-03 NOTE — Assessment & Plan Note (Signed)
History and exam consistent with acute gout flare. She is on HCTZ which can exacerbate gout.  We talked about this at length along with a low purine diet. Will get uric acid and xray today. We agreed that if her labs and xray confirm gout, will treat with colchicine but she would like to try continuing HCTZ for now and work on a low purine diet instead. Call or return to clinic prn if these symptoms worsen or fail to improve as anticipated. The patient indicates understanding of these issues and agrees with the plan.  Orders Placed This Encounter  Procedures  . DG Foot Complete Left  . Uric Acid

## 2017-05-03 NOTE — Progress Notes (Signed)
Pre visit review using our clinic review tool, if applicable. No additional management support is needed unless otherwise documented below in the visit note. 

## 2017-05-03 NOTE — Progress Notes (Signed)
Patient ID: Kathleen Jones, female   DOB: November 09, 1946, 71 y.o.   MRN: [id number]  Chief Complaint  Patient presents with  . Follow-up    mammogram     HPI Kathleen Jones is a 71 y.o. female who presents for a breast evaluation. The most recent mammogram was done on 04/19/2017 .  Patient does perform regular self breast checks and gets regular mammograms done.   Patient is going to her PCP today her left foot. She thinks she has gout.  She fell two weeks ago in the bathroom, and had extensive bruising in the medial aspect of the right thigh.    aHPI  Past Medical History:  Diagnosis Date  . Arthritis   . Breast cancer ([place]) 2010   LT LUMPECTOMY  . Cancer ([place]) 2010    T1a, N0; ER neg/ PR neg, Her 2 neu 3+ wide local excision and sentinel node biopsy her for left breast cancer in April 03, 2009. 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 Sentinel nodes were negative. Adjuvant chemo and RT.  Marland Kitchen High cholesterol   . History of chemotherapy 2010   BREAST CA  . Hypertension   . Personal history of chemotherapy   . Personal history of malignant neoplasm of breast 2010  . Personal history of radiation therapy   . Radiation 2010   BREAST CA    Past Surgical History:  Procedure Laterality Date  . ABDOMINAL HYSTERECTOMY  1983  . ABDOMINOPLASTY  1999  . BREAST BIOPSY Left 2010   +  . BREAST CYST ASPIRATION Bilateral   . BREAST LUMPECTOMY Left 2010    wide local excision and sentinel node biopsy her for left breast cancer in April 03, 2009. 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 Sentinel nodes were negative  . COLONOSCOPY  2011   Dr. Vira Agar  . COLONOSCOPY WITH PROPOFOL N/A 08/05/2016   Normal: COLONOSCOPY WITH PROPOFOL;  Surgeon: Manya Silvas, MD;  Location: [facility] ENDOSCOPY;  Service: Endoscopy;  Laterality: N/A;  . INCONTINENCE SURGERY N/A  September 17, 2015  . MM BREAST STEREO BX*L*R/S Left 2010  . Power Port placement   2010  . TONSILLECTOMY      Family History  Problem Relation Age of Onset  . Breast cancer Paternal Aunt   . Hypertension Mother   . Hyperlipidemia Mother   . Hypertension Father   . Ovarian cancer Unknown   . Colon cancer Unknown     Social History Social History  Substance Use Topics  . Smoking status: Former Smoker    Packs/day: 1.50    Years: 15.00    Types: Cigarettes    Quit date: 01/23/1982  . Smokeless tobacco: Former Systems developer     Comment: quit 1983  . Alcohol use Yes     Comment: occassionally    Allergies  Allergen Reactions  . Amlodipine     Leg swelling    Current Outpatient Prescriptions  Medication Sig Dispense Refill  . ALPHA LIPOIC ACID PO Take 1 capsule by mouth daily.    Marland Kitchen aspirin 81 MG tablet Take 81 mg by mouth daily.     Marland Kitchen atorvastatin (LIPITOR) 40 MG tablet Take 1 tablet (40 mg total) by mouth daily. 90 tablet 3  . CINNAMON PO Take 1 capsule by mouth daily.    . diclofenac (VOLTAREN) 75 MG EC tablet Take 1 tablet (75 mg total) by mouth  2 (two) times daily. 180 tablet 3  . docusate sodium (COLACE) 100 MG capsule Take 100 mg by mouth as needed for constipation.    . hydrALAZINE (APRESOLINE) 25 MG tablet Take 1 tablet (25 mg total) by mouth 3 (three) times daily. 270 tablet 3  . hydrochlorothiazide (HYDRODIURIL) 25 MG tablet Take 1 tablet (25 mg total) by mouth daily. 90 tablet 3  . nystatin cream (MYCOSTATIN) Apply 1 application topically 2 (two) times daily. (Patient taking differently: Apply 1 application topically as needed. ) 30 g 1   No current facility-administered medications for this visit.     Review of Systems Review of Systems  Constitutional: Negative.   Respiratory: Negative.   Cardiovascular: Negative.     Blood pressure (!) 144/88, pulse 84, resp. rate 12, height 4' 9"  (1.448 m), weight 143 lb (64.9 kg).  Physical Exam Physical Exam    Constitutional: She is oriented to person, place, and time. She appears well-developed.  Eyes: Conjunctivae are normal. No scleral icterus.  Neck: Neck supple.  Cardiovascular: Normal rate, regular rhythm and normal heart sounds.   Pulmonary/Chest: Effort normal and breath sounds normal. Right breast exhibits no inverted nipple, no mass, no nipple discharge, no skin change and no tenderness. Left breast exhibits no inverted nipple, no mass, no nipple discharge, no skin change and no tenderness. Breasts are asymmetrical.    Musculoskeletal:       Legs:      Feet:  Lymphadenopathy:    She has no cervical adenopathy.    She has no axillary adenopathy.  Neurological: She is alert and oriented to person, place, and time.  Skin: Skin is warm and dry.    Data Reviewed Bilateral screening mammograms dated 04/19/2017 were reviewed. Postsurgical change. BI-RADS-1. Colonoscopy of August 25, 17 reviewed. Diverticulosis. Tortuous colon reported. Dr. Vira Agar told the patient that she would likely not require additional studies.  The patient does have a past history of colonic polyps and is at a slightly higher risk of colon cancer based on her breast cancer history. Assessment    No evidence of recurrent breast cancer.    Plan    Consideration would be given to a Cologuard test in 5 years in place of colonoscopy.    Patient will be asked to return to the office in one year with a bilateral screening mammogram. The patient is aware to call back for any questions or concerns.  HPI, Physical Exam, Assessment and Plan have been scribed under the direction and in the presence of Hervey Ard, MD.  Gaspar Cola, CMA   Robert Bellow 05/03/2017, 8:31 PM

## 2017-05-03 NOTE — Patient Instructions (Signed)
Patient will be asked to return to the office in one year with a bilateral screening mammogram. The patient is aware to call back for any questions or concerns. 

## 2017-05-03 NOTE — Progress Notes (Signed)
Subjective:   Patient ID: Kathleen Jones, female    DOB: 1946/11/05, 71 y.o.   MRN: [id number]  Kathleen Jones is a pleasant 71 y.o. year old female who presents to clinic today with left foot pain  on 05/03/2017  HPI:  Acute onset of left great toe pain, redness and swelling a few days ago. Seems to be improving now. Tylenol helps with the pain.  She could not wear a shoe the first day because it was so painful.  No known injury to this foot.  Has never had anything like this before.  Current Outpatient Prescriptions on File Prior to Visit  Medication Sig Dispense Refill  . ALPHA LIPOIC ACID PO Take 1 capsule by mouth daily.    Marland Kitchen aspirin 81 MG tablet Take 81 mg by mouth daily.     Marland Kitchen atorvastatin (LIPITOR) 40 MG tablet Take 1 tablet (40 mg total) by mouth daily. 90 tablet 3  . CINNAMON PO Take 1 capsule by mouth daily.    . diclofenac (VOLTAREN) 75 MG EC tablet Take 1 tablet (75 mg total) by mouth 2 (two) times daily. 180 tablet 3  . docusate sodium (COLACE) 100 MG capsule Take 100 mg by mouth as needed for constipation.    . hydrALAZINE (APRESOLINE) 25 MG tablet Take 1 tablet (25 mg total) by mouth 3 (three) times daily. 270 tablet 3  . hydrochlorothiazide (HYDRODIURIL) 25 MG tablet Take 1 tablet (25 mg total) by mouth daily. 90 tablet 3  . nystatin cream (MYCOSTATIN) Apply 1 application topically 2 (two) times daily. (Patient taking differently: Apply 1 application topically as needed. ) 30 g 1   No current facility-administered medications on file prior to visit.     Allergies  Allergen Reactions  . Amlodipine     Leg swelling    Past Medical History:  Diagnosis Date  . Arthritis   . Breast cancer ([place]) 2010   LT LUMPECTOMY  . Cancer ([place]) 2010    T1a, N0; ER neg/ PR neg, Her 2 neu 3+ wide local excision and sentinel node biopsy her for left breast cancer in April 03, 2009. This was ER/PR negative, HER-2/neu overexpressing infiltrating lobular carcinoma with lobular  carcinoma in situ. Sentinel nodes were negative. Adjuvant chemo and RT.  Marland Kitchen High cholesterol   . History of chemotherapy 2010   BREAST CA  . Hypertension   . Personal history of chemotherapy   . Personal history of malignant neoplasm of breast 2010  . Personal history of radiation therapy   . Radiation 2010   BREAST CA    Past Surgical History:  Procedure Laterality Date  . ABDOMINAL HYSTERECTOMY  1983  . ABDOMINOPLASTY  1999  . BREAST BIOPSY Left 2010   +  . BREAST CYST ASPIRATION Bilateral   . BREAST LUMPECTOMY Left 2010    wide local excision and sentinel node biopsy her for left breast cancer in April 03, 2009. 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 Sentinel nodes were negative  . COLONOSCOPY  2011   Dr. Vira Agar  . COLONOSCOPY WITH PROPOFOL N/A 08/05/2016   Procedure: COLONOSCOPY WITH PROPOFOL;  Surgeon: Manya Silvas, MD;  Location: [facility] ENDOSCOPY;  Service: Endoscopy;  Laterality: N/A;  . INCONTINENCE SURGERY N/A September 17, 2015  . MM BREAST STEREO BX*L*R/S Left 2010  . Power Port placement   2010  . TONSILLECTOMY      Family History  Problem Relation  Age of Onset  . Breast cancer Paternal Aunt   . Hypertension Mother   . Hyperlipidemia Mother   . Hypertension Father   . Ovarian cancer Unknown   . Colon cancer Unknown     Social History   Social History  . Marital status: Married    Spouse name: N/A  . Number of children: N/A  . Years of education: N/A   Occupational History  . Not on file.   Social History Main Topics  . Smoking status: Former Smoker    Packs/day: 1.50    Years: 15.00    Types: Cigarettes    Quit date: 01/23/1982  . Smokeless tobacco: Former Systems developer     Comment: quit 1983  . Alcohol use Yes     Comment: occassionally  . Drug use: No  . Sexual activity: Yes   Other Topics Concern  . Not on file   Social History Narrative   Has a Living Will.   Desires  CPR.  Would not desire prolonged life support.            The PMH, PSH, Social History, Family History, Medications, and allergies have been reviewed in [facility], and have been updated if relevant.   Review of Systems  Constitutional: Negative.   Respiratory: Negative.   Cardiovascular: Negative.   Gastrointestinal: Negative.   Musculoskeletal: Positive for arthralgias and joint swelling.  Skin: Positive for color change.  Neurological: Negative.   All other systems reviewed and are negative.      Objective:    BP (!) 160/90   Pulse 88   Temp 98.5 F (36.9 C)   Wt 143 lb 0.1 oz (64.9 kg)   SpO2 97%   BMI 30.95 kg/m    Physical Exam  Constitutional: She is oriented to person, place, and time. She appears well-developed and well-nourished. No distress.  HENT:  Head: Normocephalic and atraumatic.  Eyes: Conjunctivae are normal.  Cardiovascular: Normal rate.   Pulmonary/Chest: Effort normal.  Musculoskeletal:       Left foot: There is tenderness and swelling.       Feet:  Neurological: She is alert and oriented to person, place, and time. No cranial nerve deficit.  Skin: She is not diaphoretic.  Psychiatric: She has a normal mood and affect. Her behavior is normal. Judgment and thought content normal.  Nursing note and vitals reviewed.         Assessment & Plan:   Left foot pain - Plan: DG Foot Complete Left, Uric Acid No Follow-up on file.

## 2017-05-05 ENCOUNTER — Other Ambulatory Visit: Payer: Self-pay | Admitting: Family Medicine

## 2017-05-05 MED ORDER — TRAMADOL HCL 50 MG PO TABS: 50.0000 mg | ORAL_TABLET | Freq: Three times a day (TID) | ORAL | 0 refills | Status: AC | PRN

## 2017-05-05 MED ORDER — COLCHICINE 0.6 MG PO TABS: 0.6000 mg | ORAL_TABLET | Freq: Two times a day (BID) | ORAL | 0 refills | Status: AC

## 2017-09-20 ENCOUNTER — Encounter: Payer: Self-pay | Admitting: Family Medicine

## 2017-09-20 ENCOUNTER — Ambulatory Visit (INDEPENDENT_AMBULATORY_CARE_PROVIDER_SITE_OTHER): Payer: Medicare Other | Admitting: Family Medicine

## 2017-09-20 VITALS — BP 192/92 | HR 92 | Temp 98.6°F | Wt 140.0 lb

## 2017-09-20 DIAGNOSIS — B9789 Other viral agents as the cause of diseases classified elsewhere: Secondary | ICD-10-CM | POA: Diagnosis not present

## 2017-09-20 DIAGNOSIS — I10 Essential (primary) hypertension: Secondary | ICD-10-CM

## 2017-09-20 DIAGNOSIS — J069 Acute upper respiratory infection, unspecified: Secondary | ICD-10-CM

## 2017-09-20 NOTE — Patient Instructions (Signed)
It was a pleasure to meet you today Please continue Delsym for cough Add plain Mucinex (generic is fine) to help thin your mucus Can use saline nasal spray for allergy symptoms If you develop a fever, sputum, wheeze or shortness of breath, please come back in to be rechecked   Upper Respiratory Infection, Adult Most upper respiratory infections (URIs) are a viral infection of the air passages leading to the lungs. A URI affects the nose, throat, and upper air passages. The most common type of URI is nasopharyngitis and is typically referred to as "the common cold." URIs run their course and usually go away on their own. Most of the time, a URI does not require medical attention, but sometimes a bacterial infection in the upper airways can follow a viral infection. This is called a secondary infection. Sinus and middle ear infections are common types of secondary upper respiratory infections. Bacterial pneumonia can also complicate a URI. A URI can worsen asthma and chronic obstructive pulmonary disease (COPD). Sometimes, these complications can require emergency medical care and may be life threatening. What are the causes? Almost all URIs are caused by viruses. A virus is a type of germ and can spread from one person to another. What increases the risk? You may be at risk for a URI if:  You smoke.  You have chronic heart or lung disease.  You have a weakened defense (immune) system.  You are very young or very old.  You have nasal allergies or asthma.  You work in crowded or poorly ventilated areas.  You work in health care facilities or schools.  What are the signs or symptoms? Symptoms typically develop 2-3 days after you come in contact with a cold virus. Most viral URIs last 7-10 days. However, viral URIs from the influenza virus (flu virus) can last 14-18 days and are typically more severe. Symptoms may include:  Runny or stuffy (congested) nose.  Sneezing.  Cough.  Sore  throat.  Headache.  Fatigue.  Fever.  Loss of appetite.  Pain in your forehead, behind your eyes, and over your cheekbones (sinus pain).  Muscle aches.  How is this diagnosed? Your health care provider may diagnose a URI by:  Physical exam.  Tests to check that your symptoms are not due to another condition such as: ? Strep throat. ? Sinusitis. ? Pneumonia. ? Asthma.  How is this treated? A URI goes away on its own with time. It cannot be cured with medicines, but medicines may be prescribed or recommended to relieve symptoms. Medicines may help:  Reduce your fever.  Reduce your cough.  Relieve nasal congestion.  Follow these instructions at home:  Take medicines only as directed by your health care provider.  Gargle warm saltwater or take cough drops to comfort your throat as directed by your health care provider.  Use a warm mist humidifier or inhale steam from a shower to increase air moisture. This may make it easier to breathe.  Drink enough fluid to keep your urine clear or pale yellow.  Eat soups and other clear broths and maintain good nutrition.  Rest as needed.  Return to work when your temperature has returned to normal or as your health care provider advises. You may need to stay home longer to avoid infecting others. You can also use a face mask and careful hand washing to prevent spread of the virus.  Increase the usage of your inhaler if you have asthma.  Do not use any  tobacco products, including cigarettes, chewing tobacco, or electronic cigarettes. If you need help quitting, ask your health care provider. How is this prevented? The best way to protect yourself from getting a cold is to practice good hygiene.  Avoid oral or hand contact with people with cold symptoms.  Wash your hands often if contact occurs.  There is no clear evidence that vitamin C, vitamin E, echinacea, or exercise reduces the chance of developing a cold. However, it is  always recommended to get plenty of rest, exercise, and practice good nutrition. Contact a health care provider if:  You are getting worse rather than better.  Your symptoms are not controlled by medicine.  You have chills.  You have worsening shortness of breath.  You have brown or red mucus.  You have yellow or brown nasal discharge.  You have pain in your face, especially when you bend forward.  You have a fever.  You have swollen neck glands.  You have pain while swallowing.  You have white areas in the back of your throat. Get help right away if:  You have severe or persistent: ? Headache. ? Ear pain. ? Sinus pain. ? Chest pain.  You have chronic lung disease and any of the following: ? Wheezing. ? Prolonged cough. ? Coughing up blood. ? A change in your usual mucus.  You have a stiff neck.  You have changes in your: ? Vision. ? Hearing. ? Thinking. ? Mood. This information is not intended to replace advice given to you by your health care provider. Make sure you discuss any questions you have with your health care provider. Document Released: 05/24/2001 Document Revised: 07/31/2016 Document Reviewed: 03/05/2014 Elsevier Interactive Patient Education  2017 [employer].

## 2017-09-20 NOTE — Progress Notes (Signed)
Subjective:    Patient ID: Kathleen Jones, female    DOB: 27-Dec-1945, 71 y.o.   MRN: [id number]  HPI This is a 71 yo female who presents today with cough x 3 days. Cough is dry. Subjective fever. Headache and right ear pain yesterday, sore throat. Feels tired and achy. Some wheezing yesterday, no SOB. Has been taking Delsym with good results. Has been using flonase. Feels a little better this morning. Slept well last night. No recent smoking (quit 1983) or asthma history.   Has history of HTN and is currently on hydralazine 25 mg TID, hctz 25 mg QD. Has been told she has "white coat hypertension," blood pressure always high at dentist and doctor offices. She has not taken any cold medicine/decongestants.   Past Medical History:  Diagnosis Date  . Arthritis   . Breast cancer ([place]) 2010   LT LUMPECTOMY  . Cancer ([place]) 2010    T1a, N0; ER neg/ PR neg, Her 2 neu 3+ wide local excision and sentinel node biopsy her for left breast cancer in April 03, 2009. 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 Sentinel nodes were negative. Adjuvant chemo and RT.  Marland Kitchen High cholesterol   . History of chemotherapy 2010   BREAST CA  . Hypertension   . Personal history of chemotherapy   . Personal history of malignant neoplasm of breast 2010  . Personal history of radiation therapy   . Radiation 2010   BREAST CA   Past Surgical History:  Procedure Laterality Date  . ABDOMINAL HYSTERECTOMY  1983  . ABDOMINOPLASTY  1999  . BREAST BIOPSY Left 2010   +  . BREAST CYST ASPIRATION Bilateral   . BREAST LUMPECTOMY Left 2010    wide local excision and sentinel node biopsy her for left breast cancer in April 03, 2009. 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 Sentinel nodes were negative  . COLONOSCOPY  2011   Dr. Vira Agar  . COLONOSCOPY WITH PROPOFOL N/A 08/05/2016   Normal:  COLONOSCOPY WITH PROPOFOL;  Surgeon: Manya Silvas, MD;  Location: [facility] ENDOSCOPY;  Service: Endoscopy;  Laterality: N/A;  . INCONTINENCE SURGERY N/A September 17, 2015  . MM BREAST STEREO BX*L*R/S Left 2010  . Power Port placement   2010  . TONSILLECTOMY     Family History  Problem Relation Age of Onset  . Breast cancer Paternal Aunt   . Hypertension Mother   . Hyperlipidemia Mother   . Hypertension Father   . Ovarian cancer Unknown   . Colon cancer Unknown    Social History  Substance Use Topics  . Smoking status: Former Smoker    Packs/day: 1.50    Years: 15.00    Types: Cigarettes    Quit date: 01/23/1982  . Smokeless tobacco: Former Systems developer     Comment: quit 1983  . Alcohol use Yes     Comment: occassionally      Review of Systems Per HPI    Objective:   Physical Exam  Constitutional: She appears well-developed and well-nourished. No distress.  HENT:  Head: Normocephalic and atraumatic.  Right Ear: Tympanic membrane and external ear normal.  Left Ear: Tympanic membrane, external ear and ear canal normal.  Nose: Nose normal.  Mouth/Throat: Oropharynx is clear and moist. No oropharyngeal exudate.  Moderate amount of light yellow, flaky cerumen in right ear.   Eyes: Conjunctivae are normal.  Neck: Normal range  of motion. Neck supple.  Cardiovascular: Normal rate, regular rhythm and normal heart sounds.   Pulmonary/Chest: Effort normal and breath sounds normal. No respiratory distress. She has no wheezes. She has no rales.  Lymphadenopathy:    She has no cervical adenopathy.  Skin: She is not diaphoretic.  Vitals reviewed.   BP (!) 196/106 (BP Location: Right Arm, Patient Position: Sitting, Cuff Size: Normal)   Pulse 92   Temp 98.6 F (37 C) (Oral)   Wt 140 lb (63.5 kg)   SpO2 95%   BMI 30.30 kg/m  Wt Readings from Last 3 Encounters:  09/20/17 140 lb (63.5 kg)  05/03/17 143 lb 0.1 oz (64.9 kg)  05/03/17 143 lb (64.9 kg)  recheck BP- 192/92 BP Readings  from Last 3 Encounters:  09/20/17 (!) 196/106  05/03/17 (!) 160/90  05/03/17 (!) 144/88       Assessment & Plan:  1. Viral URI with cough - improving, continue Delsym, can add Mucinex - discussed s/s bacterial infection and RTC precautions  2. Essential hypertension - blood pressure high in office today - recheck in 2 weeks   Clarene Reamer, FNP-BC  [facility], [place]  09/21/2017 10:09 AM

## 2017-09-25 ENCOUNTER — Other Ambulatory Visit: Payer: Self-pay | Admitting: Family Medicine

## 2017-09-25 ENCOUNTER — Telehealth: Payer: Self-pay | Admitting: Family Medicine

## 2017-09-25 MED ORDER — AMOXICILLIN 875 MG PO TABS
875.0000 mg | ORAL_TABLET | Freq: Two times a day (BID) | ORAL | 0 refills | Status: AC
Start: 2017-09-25 — End: ?

## 2017-09-25 NOTE — Telephone Encounter (Signed)
Please call patient and tell her I have sent in an antibiotic to her pharmacy. If she is not better by Thursday, please make an appointment to be seen.

## 2017-09-25 NOTE — Telephone Encounter (Signed)
Patient was seen last Wednesday.  Patient said she feels a little better, but her cough is still bad and she's having a hard time sleeping and her throat hurts from coughing.  Patient said she was told something could be called in to her pharmacy, if she wasn't feeling better. Patient has had a fever on and off. Patient uses [facility]

## 2017-09-28 NOTE — Telephone Encounter (Signed)
Patient states she feels better but has not taken the antibiotic. She will go get the antibiotic and start it and call the office if appointment is needed. Nothing further needed at this time.

## 2018-01-17 ENCOUNTER — Telehealth: Payer: Self-pay | Admitting: *Deleted

## 2018-01-17 NOTE — Telephone Encounter (Signed)
Copied from [place] [id number]. Topic: General - Other >> Jan 17, 2018 10:59 AM Carolyn Stare wrote:  Pt moved to [place] and is asking for a copy of her prescriptions be sent to her new address   [address] [place]

## 2018-01-17 NOTE — Telephone Encounter (Signed)
Pt aware that it has been mailed/thx dmf

## 2018-03-02 ENCOUNTER — Other Ambulatory Visit: Payer: Self-pay

## 2018-03-02 DIAGNOSIS — Z1231 Encounter for screening mammogram for malignant neoplasm of breast: Secondary | ICD-10-CM

## 2018-04-16 ENCOUNTER — Other Ambulatory Visit: Payer: Self-pay | Admitting: Family Medicine

## 2018-05-19 IMAGING — DX DG FOOT COMPLETE 3+V*L*
3 series · 3 of 3 positions shown · non-contrast
Comparison: None.

CLINICAL DATA: 70-year-old female with left foot pain for the past
4 days, no known injury.

EXAM:
LEFT FOOT - COMPLETE 3+ VIEW

[foot ap]
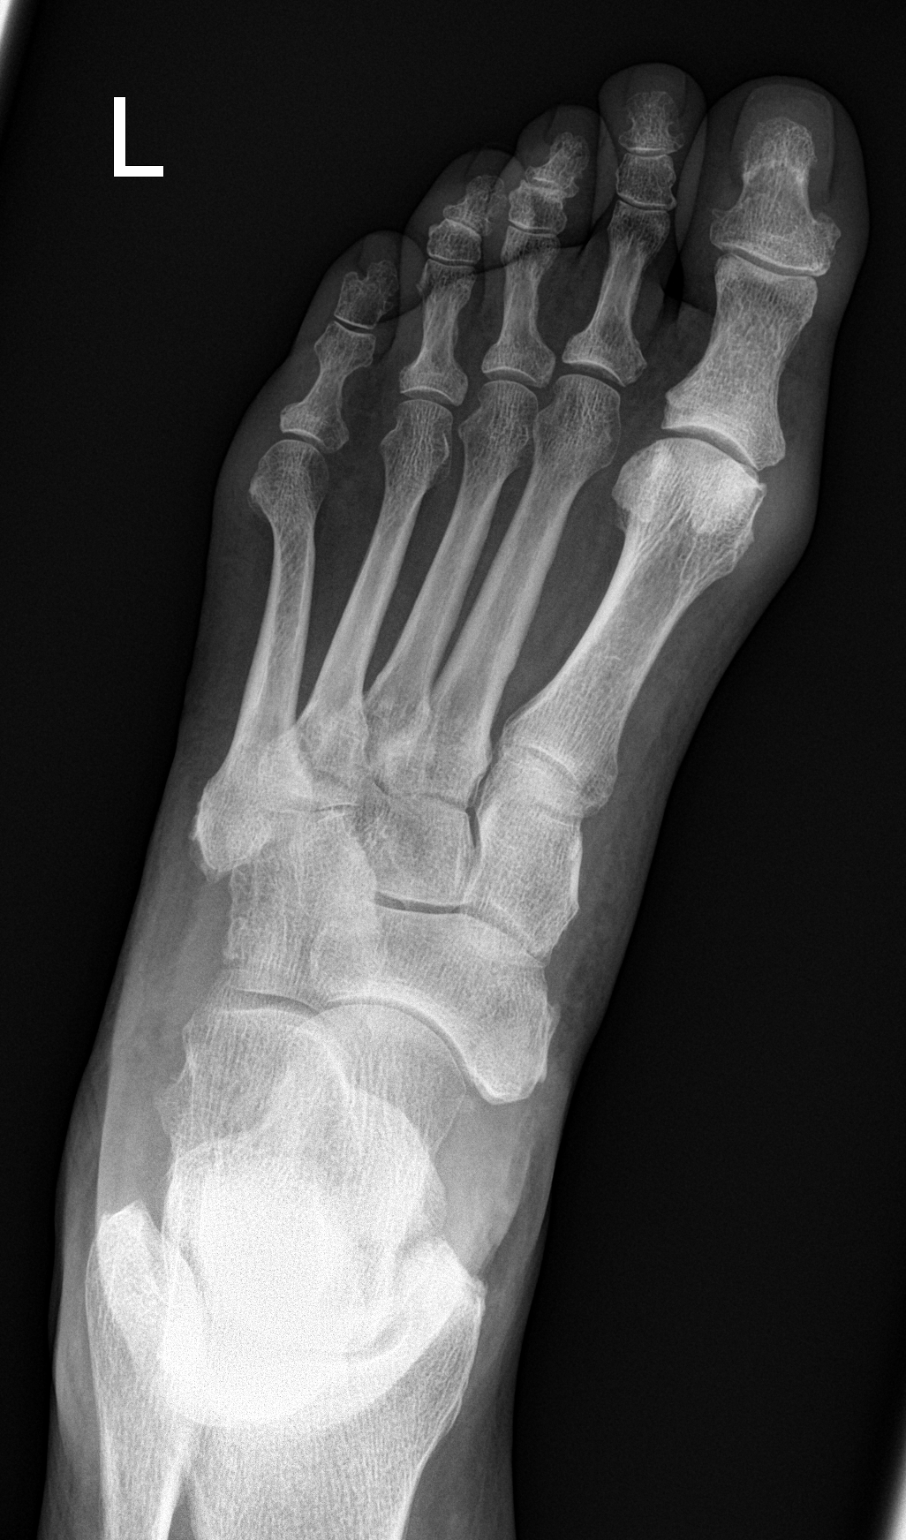

[foot obl]
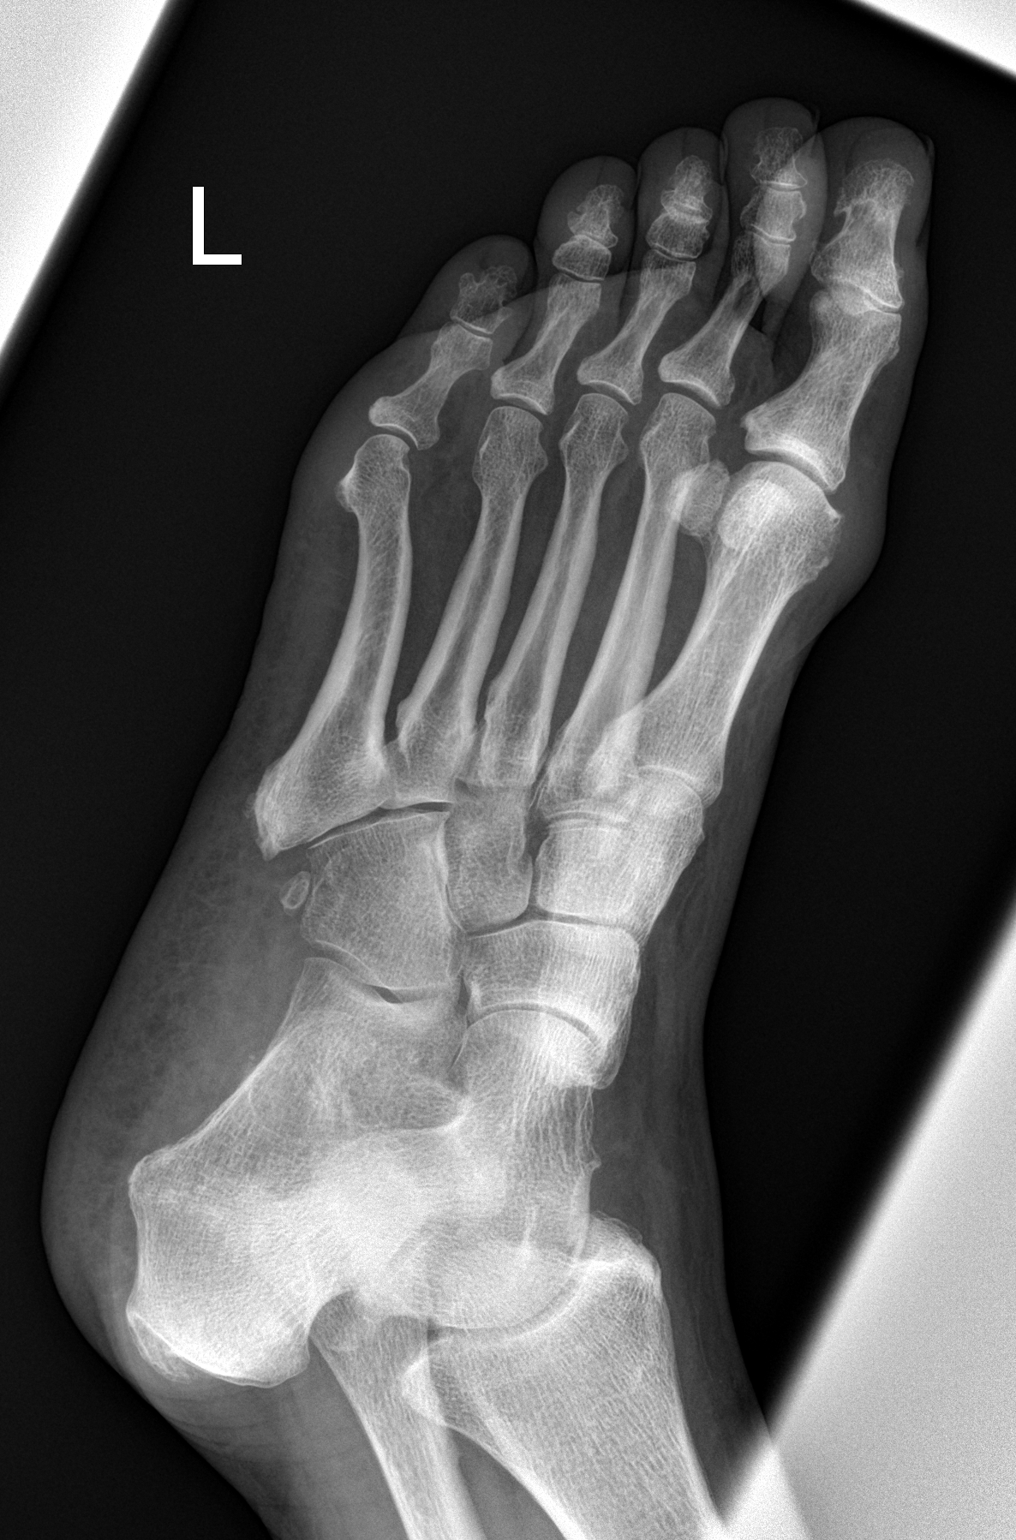

[foot lat]
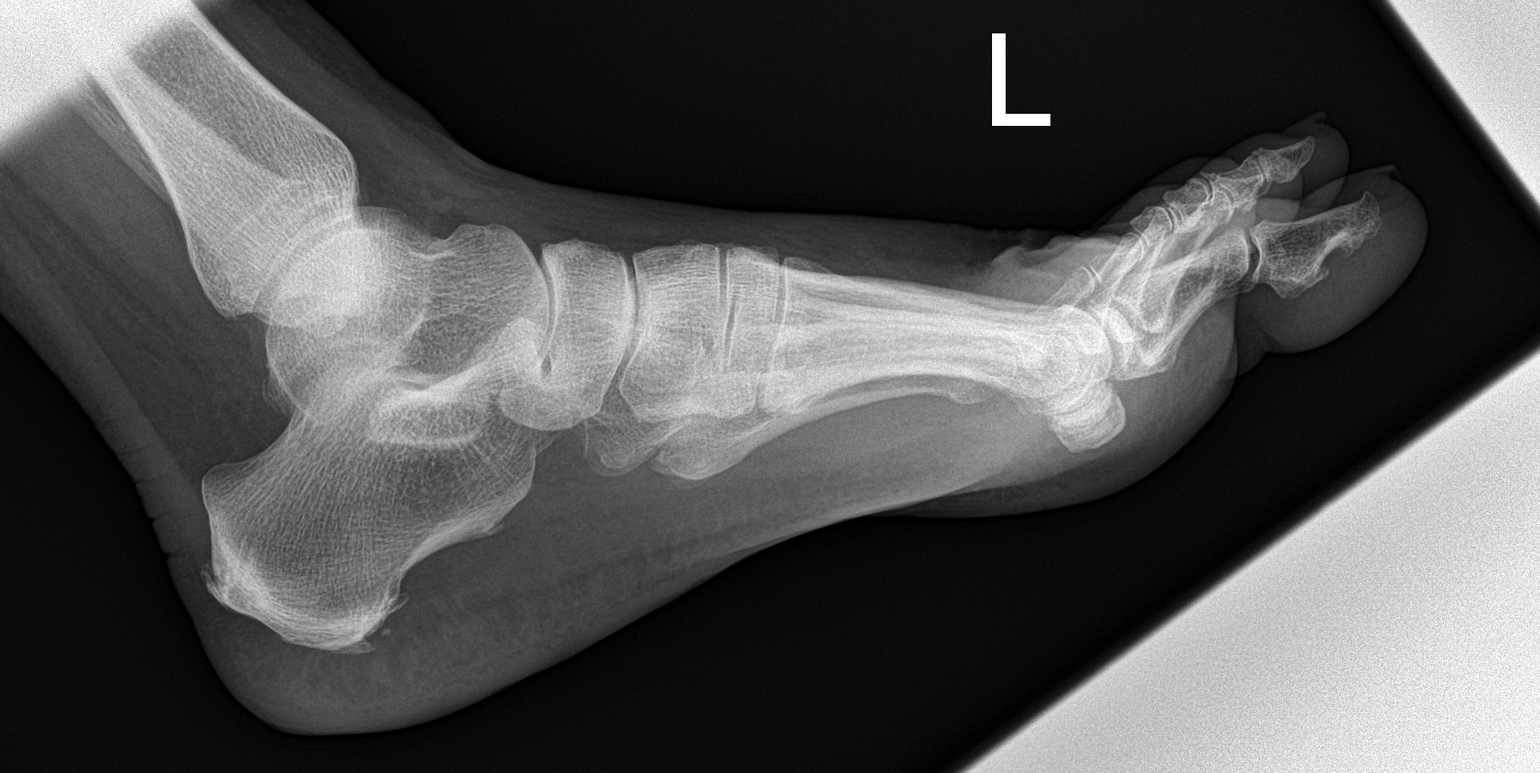

[3 of 3 positions shown; findings below may reference images not displayed]

FINDINGS: There is no evidence of fracture or dislocation. There is no
evidence of inflammatory arthropathy or other focal bone
abnormality. Mild degenerative osteoarthritis at the great toe MTP
joint. Os peroneus noted incidentally. Soft tissues are
unremarkable.
IMPRESSION: Negative.

## 2018-05-22 ENCOUNTER — Ambulatory Visit: Payer: Medicare Other | Admitting: General Surgery

## 2018-06-25 ENCOUNTER — Other Ambulatory Visit: Payer: Self-pay | Admitting: Family Medicine

## 2018-10-03 ENCOUNTER — Other Ambulatory Visit: Payer: Self-pay | Admitting: Family Medicine

## 2018-12-17 ENCOUNTER — Other Ambulatory Visit: Payer: Self-pay | Admitting: Family Medicine

## 2019-01-19 ENCOUNTER — Other Ambulatory Visit: Payer: Self-pay | Admitting: Family Medicine

## 2019-01-21 NOTE — Telephone Encounter (Signed)
Must have appt 1st/last OV was 10.2018/thx dmf

## 2019-01-22 ENCOUNTER — Other Ambulatory Visit: Payer: Self-pay | Admitting: Family Medicine

## 2019-03-09 ENCOUNTER — Other Ambulatory Visit: Payer: Self-pay | Admitting: Family Medicine

## 2019-03-11 NOTE — Telephone Encounter (Signed)
Must sched appt 1st/not seen since 2018/thx dmf

## 2023-01-24 ENCOUNTER — Encounter: Payer: Self-pay | Admitting: *Deleted
